# Patient Record
Sex: Female | Born: 1990 | Race: White | Hispanic: No | Marital: Single | Smoking: Former smoker
Health system: Southern US, Community
[De-identification: ages and names within clinical notes are randomized; demographics above are authoritative.]

## PROBLEM LIST (undated history)

## (undated) DIAGNOSIS — R87629 Unspecified abnormal cytological findings in specimens from vagina: Secondary | ICD-10-CM

## (undated) DIAGNOSIS — H9193 Unspecified hearing loss, bilateral: Secondary | ICD-10-CM

## (undated) DIAGNOSIS — F329 Major depressive disorder, single episode, unspecified: Secondary | ICD-10-CM

## (undated) DIAGNOSIS — F419 Anxiety disorder, unspecified: Secondary | ICD-10-CM

## (undated) DIAGNOSIS — R7303 Prediabetes: Secondary | ICD-10-CM

## (undated) DIAGNOSIS — E669 Obesity, unspecified: Secondary | ICD-10-CM

## (undated) DIAGNOSIS — F32A Depression, unspecified: Secondary | ICD-10-CM

## (undated) HISTORY — DX: Depression, unspecified: F32.A

## (undated) HISTORY — DX: Prediabetes: R73.03

## (undated) HISTORY — PX: WISDOM TOOTH EXTRACTION: SHX21

## (undated) HISTORY — DX: Obesity, unspecified: E66.9

## (undated) HISTORY — DX: Anxiety disorder, unspecified: F41.9

## (undated) HISTORY — DX: Unspecified hearing loss, bilateral: H91.93

## (undated) HISTORY — DX: Unspecified abnormal cytological findings in specimens from vagina: R87.629

---
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
## 2010-01-26 ENCOUNTER — Emergency Department: Payer: Self-pay | Admitting: Emergency Medicine

## 2010-01-28 LAB — PATHOLOGY REPORT

## 2014-07-19 ENCOUNTER — Ambulatory Visit: Payer: Self-pay | Admitting: Family Medicine

## 2014-12-13 DIAGNOSIS — R7303 Prediabetes: Secondary | ICD-10-CM | POA: Insufficient documentation

## 2014-12-13 DIAGNOSIS — E669 Obesity, unspecified: Secondary | ICD-10-CM | POA: Insufficient documentation

## 2014-12-17 ENCOUNTER — Ambulatory Visit (INDEPENDENT_AMBULATORY_CARE_PROVIDER_SITE_OTHER): Payer: BLUE CROSS/BLUE SHIELD | Admitting: Family Medicine

## 2014-12-17 ENCOUNTER — Encounter: Payer: Self-pay | Admitting: Family Medicine

## 2014-12-17 VITALS — BP 129/84 | HR 91 | Temp 97.6°F | Wt 215.0 lb

## 2014-12-17 DIAGNOSIS — K625 Hemorrhage of anus and rectum: Secondary | ICD-10-CM | POA: Insufficient documentation

## 2014-12-17 DIAGNOSIS — R350 Frequency of micturition: Secondary | ICD-10-CM | POA: Diagnosis not present

## 2014-12-17 DIAGNOSIS — S39012A Strain of muscle, fascia and tendon of lower back, initial encounter: Secondary | ICD-10-CM

## 2014-12-17 NOTE — Progress Notes (Signed)
BP 129/84 mmHg  Pulse 91  Temp(Src) 97.6 F (36.4 C)  Wt 215 lb (97.523 kg)  SpO2 100%  LMP 09/06/2014 (Approximate)   Subjective:    Patient ID: [name], female    DOB: 1990/08/01, 24 y.o.   MRN: [id number]  HPI: Debbie Robbins is a 24 y.o. female  Chief Complaint  Patient presents with  . Rectal Bleeding    x 1 month. Some cramps and nausea with it.   She noticed bright red blood on the toilet paper last Wednesday after a BM; not large or painful; she does not think she has any hemorrhoids; she has had some mucous in the stool; always has abdominal pain, has cysts in her uterus" and has always assumed that's what hurts; that kind of pain has been at least 5-6 years; had an US done and they said she has cysts in her uterus and they burst every so often; she verifies they are in her uterus, not on her ovaries; this was done at [facility]; no fevers; no weight loss; no rash or joint swelling; she mentioned that the caliber was smaller and then came back to normal; not persistent; comes and goes  She pulled a muscle on the right side of her back changing out kitty litter yesterday; just had a quick question about how long that might last  Also needs a form filled out to allow her to get up to use the restroom at work; I encouraged her last year to stay well-hydrated and her work wanted a note saying she could get up to pee  Family History  Problem Relation Age of Onset  . Anemia Mother   . Cancer Maternal Grandmother     ovarian  . Diabetes Maternal Grandmother   . Diabetes Paternal Grandmother   . Diabetes Paternal Grandfather   . Heart disease Paternal Grandfather     heart attack  no colon cancer in the family; no inflammatory bowel disease to her knowledge  Relevant past medical, surgical, family and social history reviewed and updated as indicated. Interim medical history since our last visit reviewed. Allergies and medications reviewed and updated.  Review of  Systems  Per HPI unless specifically indicated above     Objective:    BP 129/84 mmHg  Pulse 91  Temp(Src) 97.6 F (36.4 C)  Wt 215 lb (97.523 kg)  SpO2 100%  LMP 09/06/2014 (Approximate)  Wt Readings from Last 3 Encounters:  12/17/14 215 lb (97.523 kg)  07/20/14 212 lb (96.163 kg)    Physical Exam  Constitutional: She appears well-developed and well-nourished.  Cardiovascular: Normal rate.   Pulmonary/Chest: Effort normal.  Abdominal: She exhibits no distension and no mass. There is no tenderness. There is no guarding. No hernia.  Genitourinary: Rectal exam shows no external hemorrhoid, no fissure, no mass, no tenderness and anal tone normal. Internal hemorrhoid: perhaps very small hemorrhoid at 7 or 8 o'clock position with patient in left lateral decubitus position. Guaiac negative stool (but just scant amount of material on the glove).  Musculoskeletal:       Lumbar back: She exhibits normal range of motion, no tenderness, no edema, no deformity and no spasm.      Assessment & Plan:   Problem List Items Addressed This Visit      Digestive   Rectal bleeding - Primary    Will have patient increase fiber and water intake; stool cards given to be returned at earliest convenience; discussed referral to GI versus  watch and wait; reviewed in detail red flags and things to watch for; she opts to watch and wait at this point, but will notify me of any changes or persistence in symptoms        Other   Increased urinary frequency    I will be glad to fill out paperwork to allow patient to void at work; she needs permission apparently to leave her bench to urinate and I want her to drink 64 ounces of fluids a day       Other Visit Diagnoses    Low back strain, initial encounter        suggested ice for the first 3 days; OTC analgesics; proper lifting, bending        Follow up plan: No Follow-up on file.

## 2014-12-17 NOTE — Patient Instructions (Addendum)
Please do watch your stools and if the blood recurs, then we'll refer you to see a gastroenterologist; just let us know if you see blood again Please return the stool cards at your earliest convenience Increase your fiber gradually over the coming weeks to a total of 25 or more grams per day Try to drink 64 ounces of water or other caffeine-free beverage every day Let us know of any other symptoms Use ice for the first 3 days of back pain symptoms

## 2014-12-17 NOTE — Assessment & Plan Note (Signed)
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

## 2014-12-17 NOTE — Assessment & Plan Note (Addendum)
Will have patient increase fiber and water intake; stool cards given to be returned at earliest convenience; discussed referral to GI versus watch and wait; reviewed in detail red flags and things to watch for; she opts to watch and wait at this point, but will notify me of any changes or persistence in symptoms

## 2014-12-27 ENCOUNTER — Telehealth: Payer: Self-pay

## 2014-12-27 NOTE — Telephone Encounter (Signed)
Left message to call.

## 2014-12-27 NOTE — Telephone Encounter (Signed)
Patient notified

## 2014-12-27 NOTE — Telephone Encounter (Signed)
Great, please let patient know.

## 2014-12-27 NOTE — Telephone Encounter (Signed)
Stool cards returned all Neg

## 2018-02-28 ENCOUNTER — Other Ambulatory Visit: Payer: Self-pay

## 2018-02-28 ENCOUNTER — Ambulatory Visit: Payer: Commercial Managed Care - PPO | Admitting: Family Medicine

## 2018-02-28 ENCOUNTER — Encounter: Payer: Self-pay | Admitting: Family Medicine

## 2018-02-28 VITALS — BP 144/84 | HR 92 | Temp 98.8°F | Ht 69.0 in | Wt 228.0 lb

## 2018-02-28 DIAGNOSIS — F320 Major depressive disorder, single episode, mild: Secondary | ICD-10-CM

## 2018-02-28 DIAGNOSIS — R030 Elevated blood-pressure reading, without diagnosis of hypertension: Secondary | ICD-10-CM | POA: Diagnosis not present

## 2018-02-28 MED ORDER — HYDROXYZINE PAMOATE 25 MG PO CAPS: 25.0000 mg | ORAL_CAPSULE | Freq: Every evening | ORAL | 0 refills | Status: DC | PRN

## 2018-02-28 MED ORDER — FLUOXETINE HCL 10 MG PO CAPS: 10.0000 mg | ORAL_CAPSULE | Freq: Every day | ORAL | 0 refills | Status: DC

## 2018-02-28 NOTE — Progress Notes (Signed)
[facility]  New Patient Encounter Provider: Mike Gip, FNP    ZOX:[id number]  WJX:[id number]  DOB - October 05, 1990  SUBJECTIVE:   Debbie Robbins, is a 27 y.o. female who presents to establish care with this clinic.   Current problems/concerns:  Patient states that she thinks that she is depressed.  Patient positive for loss of interest and motivation, increased sleeping, not taking care of personal hygeine. Patient states that she has had these feelings for 3-5 years. Family hx: grandmother- bipolar with depression. Brother: depression on IVC.  Denies family hx of suicide.  Denies SI, HI, AH, VH.  Denies previous psychiatric treatment.  Mood: "I am just here".  Patient states that although she is increasing her sleep, she feels exhausted.  Patient reports that her father took wellbutrin for smoking cessation and had side effects.   No Known Allergies Past Medical History:  Diagnosis Date  . Obesity   . Prediabetes    Current Outpatient Medications on File Prior to Visit  Medication Sig Dispense Refill  . Levonorgestrel-Ethinyl Estradiol (SEASONIQUE) 0.15-0.03 &0.01 MG tablet Take 1 tablet by mouth daily.     No current facility-administered medications on file prior to visit.    Family History  Problem Relation Age of Onset  . Anemia Mother   . Cancer Maternal Grandmother        ovarian  . Diabetes Maternal Grandmother   . Diabetes Paternal Grandmother   . Diabetes Paternal Grandfather   . Heart disease Paternal Grandfather        heart attack   Social History   Socioeconomic History  . Marital status: Single    Spouse name: Not on file  . Number of children: Not on file  . Years of education: Not on file  . Highest education level: Not on file  Occupational History  . Not on file  Social Needs  . Financial resource strain: Not on file  . Food insecurity:    Worry: Not on file    Inability: Not on file  .  Transportation needs:    Medical: Not on file    Non-medical: Not on file  Tobacco Use  . Smoking status: Former Games developer  . Smokeless tobacco: Never Used  Substance and Sexual Activity  . Alcohol use: Yes    Comment: socially  . Drug use: No  . Sexual activity: Not on file  Lifestyle  . Physical activity:    Days per week: Not on file    Minutes per session: Not on file  . Stress: Not on file  Relationships  . Social connections:    Talks on phone: Not on file    Gets together: Not on file    Attends religious service: Not on file    Active member of club or organization: Not on file    Attends meetings of clubs or organizations: Not on file    Relationship status: Not on file  . Intimate partner violence:    Fear of current or ex partner: Not on file    Emotionally abused: Not on file    Physically abused: Not on file    Forced sexual activity: Not on file  Other Topics Concern  . Not on file  Social History Narrative  . Not on file    Review of Systems  Constitutional: Negative.   HENT: Negative.   Eyes: Negative.   Respiratory: Negative.   Cardiovascular: Negative.   Gastrointestinal: Negative.  Genitourinary: Negative.   Musculoskeletal: Negative.   Skin: Negative.   Neurological: Negative.   Psychiatric/Behavioral: Positive for depression. Negative for hallucinations, memory loss, substance abuse and suicidal ideas. The patient is nervous/anxious. The patient does not have insomnia.      OBJECTIVE:    BP (!) 144/84 (BP Location: Left Arm, Patient Position: Sitting, Cuff Size: Large)   Pulse 92   Temp 98.8 F (37.1 C) (Oral)   Ht 5\' 9"  (1.753 m)   Wt 228 lb (103.4 kg)   SpO2 100%   BMI 33.67 kg/m   Physical Exam  Constitutional: She is oriented to person, place, and time and well-developed, well-nourished, and in no distress. No distress.  HENT:  Head: Normocephalic and atraumatic.  Eyes: Pupils are equal, round, and reactive to light. Conjunctivae  and EOM are normal.  Neck: Normal range of motion. Neck supple.  Cardiovascular: Normal rate, regular rhythm and intact distal pulses. Exam reveals no gallop and no friction rub.  No murmur heard. Pulmonary/Chest: Effort normal and breath sounds normal. No respiratory distress. She has no wheezes.  Abdominal: Soft. Bowel sounds are normal. There is no tenderness.  Musculoskeletal: Normal range of motion. She exhibits no edema or tenderness.  Lymphadenopathy:    She has no cervical adenopathy.  Neurological: She is alert and oriented to person, place, and time. Gait normal.  Skin: Skin is warm and dry.  Psychiatric: Mood, memory, affect and judgment normal.  Nursing note and vitals reviewed.    ASSESSMENT/PLAN:  1. Current mild episode of major depressive disorder without prior episode (HCC) - FLUoxetine (PROZAC) 10 MG capsule; Take 1 capsule (10 mg total) by mouth daily. Take 1 capsule by mouth QAM x 2 weeks and then increase to 2 caps QAM x 2 weeks.  Dispense: 45 capsule; Refill: 0 - hydrOXYzine (VISTARIL) 25 MG capsule; Take 1 capsule (25 mg total) by mouth at bedtime as needed (insomnia). Take 1-2 caps po QHS PRN.  Dispense: 60 capsule; Refill: 0  2. Elevated blood pressure reading Encouraged diet and exercise. Discussed decreasing caffeine and sodium. Will continue monitor.    Return in about 3 weeks (around 03/21/2018).  The patient was given clear instructions to go to ER or return to medical center if symptoms don't improve, worsen or new problems develop. The patient verbalized understanding. The patient was told to call to get lab results if they haven't heard anything in the next week.     This note has been created with Education officer, environmentalDragon speech recognition software and smart phrase technology. Any transcriptional errors are unintentional.   [name], FNP-BC [facility] [address]  [place], [place] [address] [phone]

## 2018-02-28 NOTE — Patient Instructions (Signed)
I am starting you on a new medication in the SSRI/SNRI family for your depression/anxiety. Take this medication daily as it has been prescribed. You may experience gastrointestinal upset. This usually will stop after you are on the medication for a few days. If you have feelings of euphoria (happy and high), stop the medication immediately and go to the nearest emergency department. If you have suicidal or homicidal thoughts, delusions or hallucinations, stop the medication immediately and go to the nearest emergency department. I would like to see you again in 4 weeks.   Fluoxetine capsules or tablets (Depression/Mood Disorders) What is this medicine? FLUOXETINE (floo OX e teen) belongs to a class of drugs known as selective serotonin reuptake inhibitors (SSRIs). It helps to treat mood problems such as depression, obsessive compulsive disorder, and panic attacks. It can also treat certain eating disorders. This medicine may be used for other purposes; ask your health care provider or pharmacist if you have questions. COMMON BRAND NAME(S): Prozac What should I tell my health care provider before I take this medicine? They need to know if you have any of these conditions: -bipolar disorder or a family history of bipolar disorder -bleeding disorders -glaucoma -heart disease -liver disease -low levels of sodium in the blood -seizures -suicidal thoughts, plans, or attempt; a previous suicide attempt by you or a family member -take MAOIs like Carbex, Eldepryl, Marplan, Nardil, and Parnate -take medicines that treat or prevent blood clots -thyroid disease -an unusual or allergic reaction to fluoxetine, other medicines, foods, dyes, or preservatives -pregnant or trying to get pregnant -breast-feeding How should I use this medicine? Take this medicine by mouth with a glass of water. Follow the directions on the prescription label. You can take this medicine with or without food. Take your medicine at  regular intervals. Do not take it more often than directed. Do not stop taking this medicine suddenly except upon the advice of your doctor. Stopping this medicine too quickly may cause serious side effects or your condition may worsen. A special MedGuide will be given to you by the pharmacist with each prescription and refill. Be sure to read this information carefully each time. Talk to your pediatrician regarding the use of this medicine in children. While this drug may be prescribed for children as young as 7 years for selected conditions, precautions do apply. Overdosage: If you think you have taken too much of this medicine contact a poison control center or emergency room at once. NOTE: This medicine is only for you. Do not share this medicine with others. What if I miss a dose? If you miss a dose, skip the missed dose and go back to your regular dosing schedule. Do not take double or extra doses. What may interact with this medicine? Do not take this medicine with any of the following medications: -other medicines containing fluoxetine, like Sarafem or Symbyax -cisapride -linezolid -MAOIs like Carbex, Eldepryl, Marplan, Nardil, and Parnate -methylene blue (injected into a vein) -pimozide -thioridazine This medicine may also interact with the following medications: -alcohol -amphetamines -aspirin and aspirin-like medicines -carbamazepine -certain medicines for depression, anxiety, or psychotic disturbances -certain medicines for migraine headaches like almotriptan, eletriptan, frovatriptan, naratriptan, rizatriptan, sumatriptan, zolmitriptan -digoxin -diuretics -fentanyl -flecainide -furazolidone -isoniazid -lithium -medicines for sleep -medicines that treat or prevent blood clots like warfarin, enoxaparin, and dalteparin -NSAIDs, medicines for pain and inflammation, like ibuprofen or naproxen -phenytoin -procarbazine -propafenone -rasagiline -ritonavir -supplements like  St. John's wort, kava kava, valerian -tramadol -tryptophan -vinblastine This list  may not describe all possible interactions. Give your health care provider a list of all the medicines, herbs, non-prescription drugs, or dietary supplements you use. Also tell them if you smoke, drink alcohol, or use illegal drugs. Some items may interact with your medicine. What should I watch for while using this medicine? Tell your doctor if your symptoms do not get better or if they get worse. Visit your doctor or health care professional for regular checks on your progress. Because it may take several weeks to see the full effects of this medicine, it is important to continue your treatment as prescribed by your doctor. Patients and their families should watch out for new or worsening thoughts of suicide or depression. Also watch out for sudden changes in feelings such as feeling anxious, agitated, panicky, irritable, hostile, aggressive, impulsive, severely restless, overly excited and hyperactive, or not being able to sleep. If this happens, especially at the beginning of treatment or after a change in dose, call your health care professional. [name] may get drowsy or dizzy. Do not drive, use machinery, or do anything that needs mental alertness until you know how this medicine affects you. Do not stand or sit up quickly, especially if you are an older patient. This reduces the risk of dizzy or fainting spells. Alcohol may interfere with the effect of this medicine. Avoid alcoholic drinks. Your mouth may get dry. Chewing sugarless gum or sucking hard candy, and drinking plenty of water may help. Contact your doctor if the problem does not go away or is severe. This medicine may affect blood sugar levels. If you have diabetes, check with your doctor or health care professional before you change your diet or the dose of your diabetic medicine. What side effects may I notice from receiving this medicine? Side effects that  you should report to your doctor or health care professional as soon as possible: -allergic reactions like skin rash, itching or hives, swelling of the face, lips, or tongue -anxious -black, tarry stools -breathing problems -changes in vision -confusion -elevated mood, decreased need for sleep, racing thoughts, impulsive behavior -eye pain -fast, irregular heartbeat -feeling faint or lightheaded, falls -feeling agitated, angry, or irritable -hallucination, loss of contact with reality -loss of balance or coordination -loss of memory -painful or prolonged erections -restlessness, pacing, inability to keep still -seizures -stiff muscles -suicidal thoughts or other mood changes -trouble sleeping -unusual bleeding or bruising -unusually weak or tired -vomiting Side effects that usually do not require medical attention (report to your doctor or health care professional if they continue or are bothersome): -change in appetite or weight -change in sex drive or performance -diarrhea -dry mouth -headache -increased sweating -nausea -tremors This list may not describe all possible side effects. Call your doctor for medical advice about side effects. You may report side effects to FDA at 1-800-FDA-1088. Where should I keep my medicine? Keep out of the reach of children. Store at room temperature between 15 and 30 degrees C (59 and 86 degrees F). Throw away any unused medicine after the expiration date. NOTE: This sheet is a summary. It may not cover all possible information. If you have questions about this medicine, talk to your doctor, pharmacist, or health care provider.  2018 Elsevier/Gold Standard (2015-10-26 15:55:27) Hydroxyzine capsules or tablets What is this medicine? HYDROXYZINE (hye DROX i zeen) is an antihistamine. This medicine is used to treat allergy symptoms. It is also used to treat anxiety and tension. This medicine can be used with other  medicines to induce sleep  before surgery. This medicine may be used for other purposes; ask your health care provider or pharmacist if you have questions. COMMON BRAND NAME(S): ANX, Atarax, Rezine, Vistaril What should I tell my health care provider before I take this medicine? They need to know if you have any of these conditions: -any chronic illness -difficulty passing urine -glaucoma -heart disease -kidney disease -liver disease -lung disease -an unusual or allergic reaction to hydroxyzine, cetirizine, other medicines, foods, dyes, or preservatives -pregnant or trying to get pregnant -breast-feeding How should I use this medicine? Take this medicine by mouth with a full glass of water. Follow the directions on the prescription label. You may take this medicine with food or on an empty stomach. Take your medicine at regular intervals. Do not take your medicine more often than directed. Talk to your pediatrician regarding the use of this medicine in children. Special care may be needed. While this drug may be prescribed for children as young as 8 years of age for selected conditions, precautions do apply. Patients over 37 years old may have a stronger reaction and need a smaller dose. Overdosage: If you think you have taken too much of this medicine contact a poison control center or emergency room at once. NOTE: This medicine is only for you. Do not share this medicine with others. What if I miss a dose? If you miss a dose, take it as soon as you can. If it is almost time for your next dose, take only that dose. Do not take double or extra doses. What may interact with this medicine? -alcohol -barbiturate medicines for sleep or seizures -medicines for colds, allergies -medicines for depression, anxiety, or emotional disturbances -medicines for pain -medicines for sleep -muscle relaxants This list may not describe all possible interactions. Give your health care provider a list of all the medicines, herbs,  non-prescription drugs, or dietary supplements you use. Also tell them if you smoke, drink alcohol, or use illegal drugs. Some items may interact with your medicine. What should I watch for while using this medicine? Tell your doctor or health care professional if your symptoms do not improve. You may get drowsy or dizzy. Do not drive, use machinery, or do anything that needs mental alertness until you know how this medicine affects you. Do not stand or sit up quickly, especially if you are an older patient. This reduces the risk of dizzy or fainting spells. Alcohol may interfere with the effect of this medicine. Avoid alcoholic drinks. Your mouth may get dry. Chewing sugarless gum or sucking hard candy, and drinking plenty of water may help. Contact your doctor if the problem does not go away or is severe. This medicine may cause dry eyes and blurred vision. If you wear contact lenses you may feel some discomfort. Lubricating drops may help. See your eye doctor if the problem does not go away or is severe. If you are receiving skin tests for allergies, tell your doctor you are using this medicine. What side effects may I notice from receiving this medicine? Side effects that you should report to your doctor or health care professional as soon as possible: -fast or irregular heartbeat -difficulty passing urine -seizures -slurred speech or confusion -tremor Side effects that usually do not require medical attention (report to your doctor or health care professional if they continue or are bothersome): -constipation -drowsiness -fatigue -headache -stomach upset This list may not describe all possible side effects. Call your doctor for  medical advice about side effects. You may report side effects to FDA at 1-800-FDA-1088. Where should I keep my medicine? Keep out of the reach of children. Store at room temperature between 15 and 30 degrees C (59 and 86 degrees F). Keep container tightly closed.  Throw away any unused medicine after the expiration date. NOTE: This sheet is a summary. It may not cover all possible information. If you have questions about this medicine, talk to your doctor, pharmacist, or health care provider.  2018 Elsevier/Gold Standard (2007-10-07 14:50:59)  Mindfulness-Based Stress Reduction Mindfulness-based stress reduction (MBSR) is a program that helps people learn to practice mindfulness. Mindfulness is the practice of intentionally paying attention to the present moment. It can be learned and practiced through techniques such as education, breathing exercises, meditation, and yoga. MBSR includes several mindfulness techniques in one program. MBSR works best when you understand the treatment, are willing to try new things, and can commit to spending time practicing what you learn. MBSR training may include learning about:  How your emotions, thoughts, and reactions affect your body.  New ways to respond to things that cause negative thoughts to start (triggers).  How to notice your thoughts and let go of them.  Practicing awareness of everyday things that you normally do without thinking.  The techniques and goals of different types of meditation.  What are the benefits of MBSR? MBSR can have many benefits, which include helping you to:  Develop self-awareness. This refers to knowing and understanding yourself.  Learn skills and attitudes that help you to participate in your own health care.  Learn new ways to care for yourself.  Be more accepting about how things are, and let things go.  Be less judgmental and approach things with an open mind.  Be patient with yourself and trust yourself more.  MBSR has also been shown to:  Reduce negative emotions, such as depression and anxiety.  Improve memory and focus.  Change how you sense and approach pain.  Boost your body's ability to fight infections.  Help you connect better with other  people.  Improve your sense of well-being.  Follow these instructions at home:  Find a local in-person or online MBSR program.  Set aside some time regularly for mindfulness practice.  Find a mindfulness practice that works best for you. This may include one or more of the following: ? Meditation. Meditation involves focusing your mind on a certain thought or activity. ? Breathing awareness exercises. These help you to stay present by focusing on your breath. ? Body scan. For this practice, you lie down and pay attention to each part of your body from head to toe. You can identify tension and soreness and intentionally relax parts of your body. ? Yoga. Yoga involves stretching and breathing, and it can improve your ability to move and be flexible. It can also provide an experience of testing your body's limits, which can help you release stress. ? Mindful eating. This way of eating involves focusing on the taste, texture, color, and smell of each bite of food. Because this slows down eating and helps you feel full sooner, it can be an important part of a weight-loss plan.  Find a podcast or recording that provides guidance for breathing awareness, body scan, or meditation exercises. You can listen to these any time when you have a free moment to rest without distractions.  Follow your treatment plan as told by your health care provider. This may include taking regular  medicines and making changes to your diet or lifestyle as recommended. How to practice mindfulness To do a basic awareness exercise:  Find a comfortable place to sit.  Pay attention to the present moment. Observe your thoughts, feelings, and surroundings just as they are.  Avoid placing judgment on yourself, your feelings, or your surroundings. Make note of any judgment that comes up, and let it go.  Your mind may wander, and that is okay. Make note of when your thoughts drift, and return your attention to the present  moment.  To do basic mindfulness meditation:  Find a comfortable place to sit. This may include a stable chair or a firm floor cushion. ? Sit upright with your back straight. Let your arms fall next to your side with your hands resting on your legs. ? If sitting in a chair, rest your feet flat on the floor. ? If sitting on a cushion, cross your legs in front of you.  Keep your head in a neutral position with your chin dropped slightly. Relax your jaw and rest the tip of your tongue on the roof of your mouth. Drop your gaze to the floor. You can close your eyes if you like.  Breathe normally and pay attention to your breath. Feel the air moving in and out of your nose. Feel your belly expanding and relaxing with each breath.  Your mind may wander, and that is okay. Make note of when your thoughts drift, and return your attention to your breath.  Avoid placing judgment on yourself, your feelings, or your surroundings. Make note of any judgment or feelings that come up, let them go, and bring your attention back to your breath.  When you are ready, lift your gaze or open your eyes. Pay attention to how your body feels after the meditation.  Where to find more information: You can find more information about MBSR from:  Your health care provider.  Community-based meditation centers or programs.  Programs offered near you.  Summary  Mindfulness-based stress reduction (MBSR) is a program that teaches you how to intentionally pay attention to the present moment. It is used with other treatments to help you cope better with daily stress, emotions, and pain.  MBSR focuses on developing self-awareness, which allows you to respond to life stress without judgment or negative emotions.  MBSR programs may involve learning different mindfulness practices, such as breathing exercises, meditation, yoga, body scan, or mindful eating. Find a mindfulness practice that works best for you, and set aside  time for it on a regular basis. This information is not intended to replace advice given to you by your health care provider. Make sure you discuss any questions you have with your health care provider. Document Released: 10/01/2016 Document Revised: 10/01/2016 Document Reviewed: 10/01/2016 Elsevier Interactive Patient Education  [employer].

## 2018-03-25 ENCOUNTER — Ambulatory Visit: Payer: Commercial Managed Care - PPO | Admitting: Family Medicine

## 2018-03-25 ENCOUNTER — Encounter: Payer: Self-pay | Admitting: Family Medicine

## 2018-03-25 ENCOUNTER — Other Ambulatory Visit: Payer: Self-pay

## 2018-03-25 VITALS — BP 141/80 | HR 80 | Temp 98.0°F | Ht 69.0 in | Wt 226.0 lb

## 2018-03-25 DIAGNOSIS — F320 Major depressive disorder, single episode, mild: Secondary | ICD-10-CM

## 2018-03-25 MED ORDER — FLUOXETINE HCL 40 MG PO CAPS: 40.0000 mg | ORAL_CAPSULE | Freq: Every day | ORAL | 1 refills | Status: DC

## 2018-03-25 NOTE — Patient Instructions (Signed)
Living With Depression Everyone experiences occasional disappointment, sadness, and loss in their lives. When you are feeling down, blue, or sad for at least 2 weeks in a row, it may mean that you have depression. Depression can affect your thoughts and feelings, relationships, daily activities, and physical health. It is caused by changes in the way your brain functions. If you receive a diagnosis of depression, your health care provider will tell you which type of depression you have and what treatment options are available to you. If you are living with depression, there are ways to help you recover from it and also ways to prevent it from coming back. How to cope with lifestyle changes Coping with stress Stress is your body's reaction to life changes and events, both good and bad. Stressful situations may include:  Getting married.  The death of a spouse.  Losing a job.  Retiring.  Having a baby.  Stress can last just a few hours or it can be ongoing. Stress can play a major role in depression, so it is important to learn both how to cope with stress and how to think about it differently. Talk with your health care provider or a counselor if you would like to learn more about stress reduction. He or she may suggest some stress reduction techniques, such as:  Music therapy. This can include creating music or listening to music. Choose music that you enjoy and that inspires you.  Mindfulness-based meditation. This kind of meditation can be done while sitting or walking. It involves being aware of your normal breaths, rather than trying to control your breathing.  Centering prayer. This is a kind of meditation that involves focusing on a spiritual word or phrase. Choose a word, phrase, or sacred image that is meaningful to you and that brings you peace.  Deep breathing. To do this, expand your stomach and inhale slowly through your nose. Hold your breath for 3-5 seconds, then exhale  slowly, allowing your stomach muscles to relax.  Muscle relaxation. This involves intentionally tensing muscles then relaxing them.  Choose a stress reduction technique that fits your lifestyle and personality. Stress reduction techniques take time and practice to develop. Set aside 5-15 minutes a day to do them. Therapists can offer training in these techniques. The training may be covered by some insurance plans. Other things you can do to manage stress include:  Keeping a stress diary. This can help you learn what triggers your stress and ways to control your response.  Understanding what your limits are and saying no to requests or events that lead to a schedule that is too full.  Thinking about how you respond to certain situations. You may not be able to control everything, but you can control how you react.  Adding humor to your life by watching funny films or TV shows.  Making time for activities that help you relax and not feeling guilty about spending your time this way.  Medicines Your health care provider may suggest certain medicines if he or she feels that they will help improve your condition. Avoid using alcohol and other substances that may prevent your medicines from working properly (may interact). It is also important to:  Talk with your pharmacist or health care provider about all the medicines that you take, their possible side effects, and what medicines are safe to take together.  Make it your goal to take part in all treatment decisions (shared decision-making). This includes giving input on the side   effects of medicines. It is best if shared decision-making with your health care provider is part of your total treatment plan.  If your health care provider prescribes a medicine, you may not notice the full benefits of it for 4-8 weeks. Most people who are treated for depression need to be on medicine for at least 6-12 months after they feel better. If you are taking  medicines as part of your treatment, do not stop taking medicines without first talking to your health care provider. You may need to have the medicine slowly decreased (tapered) over time to decrease the risk of harmful side effects. Relationships Your health care provider may suggest family therapy along with individual therapy and drug therapy. While there may not be family problems that are causing you to feel depressed, it is still important to make sure your family learns as much as they can about your mental health. Having your family's support can help make your treatment successful. How to recognize changes in your condition Everyone has a different response to treatment for depression. Recovery from major depression happens when you have not had signs of major depression for two months. This may mean that you will start to:  Have more interest in doing activities.  Feel less hopeless than you did 2 months ago.  Have more energy.  Overeat less often, or have better or improving appetite.  Have better concentration.  Your health care provider will work with you to decide the next steps in your recovery. It is also important to recognize when your condition is getting worse. Watch for these signs:  Having fatigue or low energy.  Eating too much or too little.  Sleeping too much or too little.  Feeling restless, agitated, or hopeless.  Having trouble concentrating or making decisions.  Having unexplained physical complaints.  Feeling irritable, angry, or aggressive.  Get help as soon as you or your family members notice these symptoms coming back. How to get support and help from others How to talk with friends and family members about your condition Talking to friends and family members about your condition can provide you with one way to get support and guidance. Reach out to trusted friends or family members, explain your symptoms to them, and let them know that you are  working with a health care provider to treat your depression. Financial resources Not all insurance plans cover mental health care, so it is important to check with your insurance carrier. If paying for co-pays or counseling services is a problem, search for a local or county mental health care center. They may be able to offer public mental health care services at low or no cost when you are not able to see a private health care provider. If you are taking medicine for depression, you may be able to get the generic form, which may be less expensive. Some makers of prescription medicines also offer help to patients who cannot afford the medicines they need. Follow these instructions at home:  Get the right amount and quality of sleep.  Cut down on using caffeine, tobacco, alcohol, and other potentially harmful substances.  Try to exercise, such as walking or lifting small weights.  Take over-the-counter and prescription medicines only as told by your health care provider.  Eat a healthy diet that includes plenty of vegetables, fruits, whole grains, low-fat dairy products, and lean protein. Do not eat a lot of foods that are high in solid fats, added sugars, or salt.    Keep all follow-up visits as told by your health care provider. This is important. Contact a health care provider if:  You stop taking your antidepressant medicines, and you have any of these symptoms: ? Nausea. ? Headache. ? Feeling lightheaded. ? Chills and body aches. ? Not being able to sleep (insomnia).  You or your friends and family think your depression is getting worse. Get help right away if:  You have thoughts of hurting yourself or others. If you ever feel like you may hurt yourself or others, or have thoughts about taking your own life, get help right away. You can go to your nearest emergency department or call:  Your local emergency services (911 in the [place]  A suicide crisis helpline, such as the  National Suicide Prevention Lifeline at 1-800-273-8255. This is open 24-hours a day.  Summary  If you are living with depression, there are ways to help you recover from it and also ways to prevent it from coming back.  Work with your health care team to create a management plan that includes counseling, stress management techniques, and healthy lifestyle habits. This information is not intended to replace advice given to you by your health care provider. Make sure you discuss any questions you have with your health care provider. Document Released: 04/27/2016 Document Revised: 04/27/2016 Document Reviewed: 04/27/2016 Elsevier Interactive Patient Education  2018 Elsevier Inc.  

## 2018-03-25 NOTE — Progress Notes (Signed)
  [facility]   Progress Note: General Provider: Mike Gip, FNP  SUBJECTIVE:   Debbie Robbins is a 27 y.o. female who  has a past medical history of Obesity and Prediabetes.. Patient presents today for Depression (not much improvement with medications, needs refill on prozac) Patient presents for follow-up on depression.  She states that she has had a few days of improvement but overall mood is still the same.  She is sleeping better with Vistaril.  She states that she is still looking for a Veterinary surgeon.  She denies suicidal homicidal thoughts auditory or visual hallucinations.  She would like to increase the medication. Review of Systems  Psychiatric/Behavioral: Positive for depression (Improving). Negative for hallucinations, memory loss, substance abuse and suicidal ideas. The patient is not nervous/anxious and does not have insomnia.      OBJECTIVE: BP (!) 141/80 (BP Location: Left Arm, Patient Position: Sitting, Cuff Size: Large)   Pulse 80   Temp 98 F (36.7 C) (Oral)   Ht 5\' 9"  (1.753 m)   Wt 226 lb (102.5 kg)   SpO2 100%   BMI 33.37 kg/m   Physical Exam  Constitutional: She is oriented to person, place, and time.  Neurological: She is alert and oriented to person, place, and time.  Psychiatric: She has a normal mood and affect. Her speech is normal and behavior is normal. Judgment and thought content normal. She is not actively hallucinating. Cognition and memory are normal.  Nursing note and vitals reviewed.   ASSESSMENT/PLAN:  1. Current mild episode of major depressive disorder without prior episode (HCC) Increased prozac to 40mg  daily. Continue with vistaril - FLUoxetine (PROZAC) 40 MG capsule; Take 1 capsule (40 mg total) by mouth daily.  Dispense: 30 capsule; Refill: 1        The patient was given clear instructions to go to ER or return to medical center if symptoms do not improve, worsen or new problems  develop. The patient verbalized understanding and agreed with plan of care.   Ms. Debbie Robbins. Riley Lam, FNP-BC [facility] [address] [place], [place] [address] [phone]     This note has been created with Dragon speech recognition software and smart phrase technology. Any transcriptional errors are unintentional.

## 2018-05-11 ENCOUNTER — Other Ambulatory Visit: Payer: Self-pay

## 2018-05-11 DIAGNOSIS — F320 Major depressive disorder, single episode, mild: Secondary | ICD-10-CM

## 2018-05-11 MED ORDER — FLUOXETINE HCL 40 MG PO CAPS: 40.0000 mg | ORAL_CAPSULE | Freq: Every day | ORAL | 1 refills | Status: DC

## 2018-05-27 ENCOUNTER — Ambulatory Visit (INDEPENDENT_AMBULATORY_CARE_PROVIDER_SITE_OTHER): Payer: Commercial Managed Care - PPO | Admitting: Family Medicine

## 2018-05-27 ENCOUNTER — Encounter: Payer: Self-pay | Admitting: Family Medicine

## 2018-05-27 DIAGNOSIS — F320 Major depressive disorder, single episode, mild: Secondary | ICD-10-CM | POA: Diagnosis not present

## 2018-05-27 MED ORDER — PROPRANOLOL HCL 20 MG PO TABS: 20.0000 mg | ORAL_TABLET | Freq: Two times a day (BID) | ORAL | 2 refills | Status: DC

## 2018-05-27 MED ORDER — HYDROXYZINE PAMOATE 25 MG PO CAPS: 25.0000 mg | ORAL_CAPSULE | Freq: Every evening | ORAL | 0 refills | Status: DC | PRN

## 2018-05-27 MED ORDER — SERTRALINE HCL 50 MG PO TABS: 50.0000 mg | ORAL_TABLET | Freq: Every day | ORAL | 0 refills | Status: DC

## 2018-05-27 NOTE — Patient Instructions (Addendum)
You can stop Prozac and start Zoloft 50 mg daily x 2 weeks. Increase to 100mg  after.  You can take melatonin for sleep along with hydroxyzine.    Sertraline tablets What is this medicine? SERTRALINE (SER tra leen) is used to treat depression. It may also be used to treat obsessive compulsive disorder, panic disorder, post-trauma stress, premenstrual dysphoric disorder (PMDD) or social anxiety. This medicine may be used for other purposes; ask your health care provider or pharmacist if you have questions. COMMON BRAND NAME(S): Zoloft What should I tell my health care provider before I take this medicine? They need to know if you have any of these conditions: -bleeding disorders -bipolar disorder or a family history of bipolar disorder -glaucoma -heart disease -high blood pressure -history of irregular heartbeat -history of low levels of calcium, magnesium, or potassium in the blood -if you often drink alcohol -liver disease -receiving electroconvulsive therapy -seizures -suicidal thoughts, plans, or attempt; a previous suicide attempt by you or a family member -take medicines that treat or prevent blood clots -thyroid disease -an unusual or allergic reaction to sertraline, other medicines, foods, dyes, or preservatives -pregnant or trying to get pregnant -breast-feeding How should I use this medicine? Take this medicine by mouth with a glass of water. Follow the directions on the prescription label. You can take it with or without food. Take your medicine at regular intervals. Do not take your medicine more often than directed. Do not stop taking this medicine suddenly except upon the advice of your doctor. Stopping this medicine too quickly may cause serious side effects or your condition may worsen. A special MedGuide will be given to you by the pharmacist with each prescription and refill. Be sure to read this information carefully each time. Talk to your pediatrician regarding the  use of this medicine in children. While this drug may be prescribed for children as young as 7 years for selected conditions, precautions do apply. Overdosage: If you think you have taken too much of this medicine contact a poison control center or emergency room at once. NOTE: This medicine is only for you. Do not share this medicine with others. What if I miss a dose? If you miss a dose, take it as soon as you can. If it is almost time for your next dose, take only that dose. Do not take double or extra doses. What may interact with this medicine? Do not take this medicine with any of the following medications: -cisapride -dofetilide -dronedarone -linezolid -MAOIs like Carbex, Eldepryl, Marplan, Nardil, and Parnate -methylene blue (injected into a vein) -pimozide -thioridazine This medicine may also interact with the following medications: -alcohol -amphetamines -aspirin and aspirin-like medicines -certain medicines for depression, anxiety, or psychotic disturbances -certain medicines for fungal infections like ketoconazole, fluconazole, posaconazole, and itraconazole -certain medicines for irregular heart beat like flecainide, quinidine, propafenone -certain medicines for migraine headaches like almotriptan, eletriptan, frovatriptan, naratriptan, rizatriptan, sumatriptan, zolmitriptan -certain medicines for sleep -certain medicines for seizures like carbamazepine, valproic acid, phenytoin -certain medicines that treat or prevent blood clots like warfarin, enoxaparin, dalteparin -cimetidine -digoxin -diuretics -fentanyl -isoniazid -lithium -NSAIDs, medicines for pain and inflammation, like ibuprofen or naproxen -other medicines that prolong the QT interval (cause an abnormal heart rhythm) -rasagiline -safinamide -supplements like St. John's wort, kava kava, valerian -tolbutamide -tramadol -tryptophan This list may not describe all possible interactions. Give your health care  provider a list of all the medicines, herbs, non-prescription drugs, or dietary supplements you use. Also tell them if  you smoke, drink alcohol, or use illegal drugs. Some items may interact with your medicine. What should I watch for while using this medicine? Tell your doctor if your symptoms do not get better or if they get worse. Visit your doctor or health care professional for regular checks on your progress. Because it may take several weeks to see the full effects of this medicine, it is important to continue your treatment as prescribed by your doctor. Patients and their families should watch out for new or worsening thoughts of suicide or depression. Also watch out for sudden changes in feelings such as feeling anxious, agitated, panicky, irritable, hostile, aggressive, impulsive, severely restless, overly excited and hyperactive, or not being able to sleep. If this happens, especially at the beginning of treatment or after a change in dose, call your health care professional. [name] may get drowsy or dizzy. Do not drive, use machinery, or do anything that needs mental alertness until you know how this medicine affects you. Do not stand or sit up quickly, especially if you are an older patient. This reduces the risk of dizzy or fainting spells. Alcohol may interfere with the effect of this medicine. Avoid alcoholic drinks. Your mouth may get dry. Chewing sugarless gum or sucking hard candy, and drinking plenty of water may help. Contact your doctor if the problem does not go away or is severe. What side effects may I notice from receiving this medicine? Side effects that you should report to your doctor or health care professional as soon as possible: -allergic reactions like skin rash, itching or hives, swelling of the face, lips, or tongue -anxious -black, tarry stools -changes in vision -confusion -elevated mood, decreased need for sleep, racing thoughts, impulsive behavior -eye pain -fast,  irregular heartbeat -feeling faint or lightheaded, falls -feeling agitated, angry, or irritable -hallucination, loss of contact with reality -loss of balance or coordination -loss of memory -painful or prolonged erections -restlessness, pacing, inability to keep still -seizures -stiff muscles -suicidal thoughts or other mood changes -trouble sleeping -unusual bleeding or bruising -unusually weak or tired -vomiting Side effects that usually do not require medical attention (report to your doctor or health care professional if they continue or are bothersome): -change in appetite or weight -change in sex drive or performance -diarrhea -increased sweating -indigestion, nausea -tremors This list may not describe all possible side effects. Call your doctor for medical advice about side effects. You may report side effects to FDA at 1-800-FDA-1088. Where should I keep my medicine? Keep out of the reach of children. Store at room temperature between 15 and 30 degrees C (59 and 86 degrees F). Throw away any unused medicine after the expiration date. NOTE: This sheet is a summary. It may not cover all possible information. If you have questions about this medicine, talk to your doctor, pharmacist, or health care provider.  2019 Elsevier/Gold Standard (2016-05-29 14:17:49) Propranolol tablets What is this medicine? PROPRANOLOL (proe PRAN oh lole) is a beta-blocker. Beta-blockers reduce the workload on the heart and help it to beat more regularly. This medicine is used to treat high blood pressure, to control irregular heart rhythms (arrhythmias) and to relieve chest pain caused by angina. It may also be helpful after a heart attack. This medicine is also used to prevent migraine headaches, relieve uncontrollable shaking (tremors), and help certain problems related to the thyroid gland and adrenal gland. This medicine may be used for other purposes; ask your health care provider or pharmacist if  you have  questions. COMMON BRAND NAME(S): Inderal What should I tell my health care provider before I take this medicine? They need to know if you have any of these conditions: -circulation problems or blood vessel disease -diabetes -history of heart attack or heart disease, vasospastic angina -kidney disease -liver disease -lung or breathing disease, like asthma or emphysema -pheochromocytoma -slow heart rate -thyroid disease -an unusual or allergic reaction to propranolol, other beta-blockers, medicines, foods, dyes, or preservatives -pregnant or trying to get pregnant -breast-feeding How should I use this medicine? Take this medicine by mouth with a glass of water. Follow the directions on the prescription label. Take your doses at regular intervals. Do not take your medicine more often than directed. Do not stop taking except on your the advice of your doctor or health care professional. Talk to your pediatrician regarding the use of this medicine in children. Special care may be needed. Overdosage: If you think you have taken too much of this medicine contact a poison control center or emergency room at once. NOTE: This medicine is only for you. Do not share this medicine with others. What if I miss a dose? If you miss a dose, take it as soon as you can. If it is almost time for your next dose, take only that dose. Do not take double or extra doses. What may interact with this medicine? Do not take this medicine with any of the following medications: -feverfew -phenothiazines like chlorpromazine, mesoridazine, prochlorperazine, thioridazine This medicine may also interact with the following medications: -aluminum hydroxide gel -antipyrine -antiviral medicines for HIV or AIDS -barbiturates like phenobarbital -certain medicines for blood pressure, heart disease, irregular heart beat -cimetidine -ciprofloxacin -diazepam -fluconazole -haloperidol -isoniazid -medicines for  cholesterol like cholestyramine or colestipol -medicines for mental depression -medicines for migraine headache like almotriptan, eletriptan, frovatriptan, naratriptan, rizatriptan, sumatriptan, zolmitriptan -NSAIDs, medicines for pain and inflammation, like ibuprofen or naproxen -phenytoin -rifampin -teniposide -theophylline -thyroid medicines -tolbutamide -warfarin -zileuton This list may not describe all possible interactions. Give your health care provider a list of all the medicines, herbs, non-prescription drugs, or dietary supplements you use. Also tell them if you smoke, drink alcohol, or use illegal drugs. Some items may interact with your medicine. What should I watch for while using this medicine? Visit your doctor or health care professional for regular check ups. Check your blood pressure and pulse rate regularly. Ask your health care professional what your blood pressure and pulse rate should be, and when you should contact them. You may get drowsy or dizzy. Do not drive, use machinery, or do anything that needs mental alertness until you know how this drug affects you. Do not stand or sit up quickly, especially if you are an older patient. This reduces the risk of dizzy or fainting spells. Alcohol can make you more drowsy and dizzy. Avoid alcoholic drinks. This medicine can affect blood sugar levels. If you have diabetes, check with your doctor or health care professional before you change your diet or the dose of your diabetic medicine. Do not treat yourself for coughs, colds, or pain while you are taking this medicine without asking your doctor or health care professional for advice. Some ingredients may increase your blood pressure. What side effects may I notice from receiving this medicine? Side effects that you should report to your doctor or health care professional as soon as possible: -allergic reactions like skin rash, itching or hives, swelling of the face, lips, or  tongue -breathing problems -changes in blood sugar -  cold hands or feet -difficulty sleeping, nightmares -dry peeling skin -hallucinations -muscle cramps or weakness -slow heart rate -swelling of the legs and ankles -vomiting Side effects that usually do not require medical attention (report to your doctor or health care professional if they continue or are bothersome): -change in sex drive or performance -diarrhea -dry sore eyes -hair loss -nausea -weak or tired This list may not describe all possible side effects. Call your doctor for medical advice about side effects. You may report side effects to FDA at 1-800-FDA-1088. Where should I keep my medicine? Keep out of the reach of children. Store at room temperature between 15 and 30 degrees C (59 and 86 degrees F). Protect from light. Throw away any unused medicine after the expiration date. NOTE: This sheet is a summary. It may not cover all possible information. If you have questions about this medicine, talk to your doctor, pharmacist, or health care provider.  2019 Elsevier/Gold Standard (2013-01-27 14:51:53)

## 2018-05-27 NOTE — Progress Notes (Signed)
  [facility]   Progress Note: General Provider: Mike GipAndre Rosaire Cueto, FNP  SUBJECTIVE:   Debbie Robbins is a 27 y.o. female who  has a past medical history of Obesity and Prediabetes.. Patient presents today for Follow-up and Depression Patient presents for follow-up on depression.  She states that since increasing her Prozac, she noticed a jittery feeling that she does not like.  Patient states that she continues to have anxiety especially in social situations.  Would like to change to another medication in the SSRI category. She denies suicidal ideations, intent, or plan.  She denies homicidal ideations, auditory hallucinations, or visual hallucinations.  She also endorses difficulty with falling and staying asleep.  She has not tried any medications for this in the past.  Review of Systems  Constitutional: Negative.   HENT: Negative.   Eyes: Negative.   Skin: Negative.   Neurological: Negative.   Psychiatric/Behavioral: Positive for depression. Negative for hallucinations, memory loss, substance abuse and suicidal ideas. The patient is nervous/anxious and has insomnia.      OBJECTIVE: BP 140/86 (BP Location: Left Arm, Patient Position: Sitting, Cuff Size: Large)   Pulse 77   Temp 98.8 F (37.1 C) (Oral)   Resp 16   Ht 5\' 9"  (1.753 m)   Wt 216 lb (98 kg)   LMP 05/24/2018   SpO2 100%   BMI 31.90 kg/m   Wt Readings from Last 3 Encounters:  05/27/18 216 lb (98 kg)  03/25/18 226 lb (102.5 kg)  02/28/18 228 lb (103.4 kg)     Physical Exam  ASSESSMENT/PLAN:   1. Current mild episode of major depressive disorder without prior episode [facility](HCC) Patient to discontinue Prozac.  She will start Zoloft at 50 mg once a day for 2 weeks and then increase to 2 pills daily for total of 500 mg a day. We will start on a trial of propanolol for social anxiety.  She can take this twice a day as needed.  Discussed benefits and risks of medication. We  will also start Vistaril for insomnia 25 to 50 mg nightly. - sertraline (ZOLOFT) 50 MG tablet; Take 1 tablet (50 mg total) by mouth daily. Take 1 pill po x 2 weeks then increase to 2 pills daily.  Dispense: 45 tablet; Refill: 0 - propranolol (INDERAL) 20 MG tablet; Take 1 tablet (20 mg total) by mouth 2 (two) times daily.  Dispense: 60 tablet; Refill: 2 - hydrOXYzine (VISTARIL) 25 MG capsule; Take 1 capsule (25 mg total) by mouth at bedtime as needed (insomnia). Take 1-2 caps po QHS PRN.  Dispense: 60 capsule; Refill: 0    Return in about 3 weeks (around 06/17/2018) for F/u depression new medication. .    The patient was given clear instructions to go to ER or return to medical center if symptoms do not improve, worsen or new problems develop. The patient verbalized understanding and agreed with plan of care.   Ms. Freda Jacksonndr L. Riley Lamouglas, FNP-BC [facility] [address]  [place], [place] [id number] [phone]

## 2018-06-16 DIAGNOSIS — N939 Abnormal uterine and vaginal bleeding, unspecified: Secondary | ICD-10-CM | POA: Insufficient documentation

## 2018-06-20 ENCOUNTER — Ambulatory Visit (INDEPENDENT_AMBULATORY_CARE_PROVIDER_SITE_OTHER): Payer: Commercial Managed Care - PPO | Admitting: Family Medicine

## 2018-06-20 ENCOUNTER — Encounter: Payer: Self-pay | Admitting: Family Medicine

## 2018-06-20 VITALS — BP 134/80 | HR 64 | Temp 98.7°F | Resp 16 | Ht 69.0 in | Wt 215.0 lb

## 2018-06-20 DIAGNOSIS — F320 Major depressive disorder, single episode, mild: Secondary | ICD-10-CM

## 2018-06-20 MED ORDER — PROPRANOLOL HCL ER 60 MG PO CP24: 60.0000 mg | ORAL_CAPSULE | Freq: Every day | ORAL | 5 refills | Status: DC

## 2018-06-20 MED ORDER — SERTRALINE HCL 50 MG PO TABS: 75.0000 mg | ORAL_TABLET | Freq: Every day | ORAL | 3 refills | Status: DC

## 2018-06-20 NOTE — Patient Instructions (Signed)
Generalized Anxiety Disorder, Adult Generalized anxiety disorder (GAD) is a mental health disorder. People with this condition constantly worry about everyday events. Unlike normal anxiety, worry related to GAD is not triggered by a specific event. These worries also do not fade or get better with time. GAD interferes with life functions, including relationships, work, and school. GAD can vary from mild to severe. People with severe GAD can have intense waves of anxiety with physical symptoms (panic attacks). What are the causes? The exact cause of GAD is not known. What increases the risk? This condition is more likely to develop in:  Women.  People who have a family history of anxiety disorders.  People who are very shy.  People who experience very stressful life events, such as the death of a loved one.  People who have a very stressful family environment. What are the signs or symptoms? People with GAD often worry excessively about many things in their lives, such as their health and family. They may also be overly concerned about:  Doing well at work.  Being on time.  Natural disasters.  Friendships. Physical symptoms of GAD include:  Fatigue.  Muscle tension or having muscle twitches.  Trembling or feeling shaky.  Being easily startled.  Feeling like your heart is pounding or racing.  Feeling out of breath or like you cannot take a deep breath.  Having trouble falling asleep or staying asleep.  Sweating.  Nausea, diarrhea, or irritable bowel syndrome (IBS).  Headaches.  Trouble concentrating or remembering facts.  Restlessness.  Irritability. How is this diagnosed? Your health care provider can diagnose GAD based on your symptoms and medical history. You will also have a physical exam. The health care provider will ask specific questions about your symptoms, including how severe they are, when they started, and if they come and go. Your health care  provider may ask you about your use of alcohol or drugs, including prescription medicines. Your health care provider may refer you to a mental health specialist for further evaluation. Your health care provider will do a thorough examination and may perform additional tests to rule out other possible causes of your symptoms. To be diagnosed with GAD, a person must have anxiety that:  Is out of his or her control.  Affects several different aspects of his or her life, such as work and relationships.  Causes distress that makes him or her unable to take part in normal activities.  Includes at least three physical symptoms of GAD, such as restlessness, fatigue, trouble concentrating, irritability, muscle tension, or sleep problems. Before your health care provider can confirm a diagnosis of GAD, these symptoms must be present more days than they are not, and they must last for six months or longer. How is this treated? The following therapies are usually used to treat GAD:  Medicine. Antidepressant medicine is usually prescribed for long-term daily control. Antianxiety medicines may be added in severe cases, especially when panic attacks occur.  Talk therapy (psychotherapy). Certain types of talk therapy can be helpful in treating GAD by providing support, education, and guidance. Options include: ? Cognitive behavioral therapy (CBT). People learn coping skills and techniques to ease their anxiety. They learn to identify unrealistic or negative thoughts and behaviors and to replace them with positive ones. ? Acceptance and commitment therapy (ACT). This treatment teaches people how to be mindful as a way to cope with unwanted thoughts and feelings. ? Biofeedback. This process trains you to manage your body's response (  physiological response) through breathing techniques and relaxation methods. You will work with a therapist while machines are used to monitor your physical symptoms.  Stress  management techniques. These include yoga, meditation, and exercise. A mental health specialist can help determine which treatment is best for you. Some people see improvement with one type of therapy. However, other people require a combination of therapies. Follow these instructions at home:  Take over-the-counter and prescription medicines only as told by your health care provider.  Try to maintain a normal routine.  Try to anticipate stressful situations and allow extra time to manage them.  Practice any stress management or self-calming techniques as taught by your health care provider.  Do not punish yourself for setbacks or for not making progress.  Try to recognize your accomplishments, even if they are small.  Keep all follow-up visits as told by your health care provider. This is important. Contact a health care provider if:  Your symptoms do not get better.  Your symptoms get worse.  You have signs of depression, such as: ? A persistently sad, cranky, or irritable mood. ? Loss of enjoyment in activities that used to bring you joy. ? Change in weight or eating. ? Changes in sleeping habits. ? Avoiding friends or family members. ? Loss of energy for normal tasks. ? Feelings of guilt or worthlessness. Get help right away if:  You have serious thoughts about hurting yourself or others. If you ever feel like you may hurt yourself or others, or have thoughts about taking your own life, get help right away. You can go to your nearest emergency department or call:  Your local emergency services (911 in the [place]  A suicide crisis helpline, such as the [place] at 2188233781. This is open 24 hours a day. Summary  Generalized anxiety disorder (GAD) is a mental health disorder that involves worry that is not triggered by a specific event.  People with GAD often worry excessively about many things in their lives, such as their health and  family.  GAD may cause physical symptoms such as restlessness, trouble concentrating, sleep problems, frequent sweating, nausea, diarrhea, headaches, and trembling or muscle twitching.  A mental health specialist can help determine which treatment is best for you. Some people see improvement with one type of therapy. However, other people require a combination of therapies. This information is not intended to replace advice given to you by your health care provider. Make sure you discuss any questions you have with your health care provider. Document Released: 09/19/2012 Document Revised: 04/14/2016 Document Reviewed: 04/14/2016 Elsevier Interactive Patient Education  2019 [place]. Major Depressive Disorder, Adult Major depressive disorder (MDD) is a mental health condition. MDD often makes you feel sad, hopeless, or helpless. MDD can also cause symptoms in your body. MDD can affect your:  Work.  School.  Relationships.  Other normal activities. MDD can range from mild to very bad. It may occur once (single episode MDD). It can also occur many times (recurrent MDD). The main symptoms of MDD often include:  Feeling sad, depressed, or irritable most of the time.  Loss of interest. MDD symptoms also include:  Sleeping too much or too little.  Eating too much or too little.  A change in your weight.  Feeling tired (fatigue) or having low energy.  Feeling worthless.  Feeling guilty.  Trouble making decisions.  Trouble thinking clearly.  Thoughts of suicide or harming others.  Feeling weak.  Feeling agitated.  Keeping yourself from being around other people (isolation). Follow these instructions at home: Activity  Do these things as told by your doctor: ? Go back to your normal activities. ? Exercise regularly. ? Spend time outdoors. Alcohol  Talk with your doctor about how alcohol can affect your antidepressant medicines.  Do not drink alcohol. Or, limit  how much alcohol you drink. ? This means no more than 1 drink a day for nonpregnant women and 2 drinks a day for men. One drink equals one of these:  12 oz of beer.  5 oz of wine.  1 oz of hard liquor. General instructions  Take over-the-counter and prescription medicines only as told by your doctor.  Eat a healthy diet.  Get plenty of sleep.  Find activities that you enjoy. Make time to do them.  Think about joining a support group. Your doctor may be able to suggest a group for you.  Keep all follow-up visits as told by your doctor. This is important. Where to find more information:  The [employer] on Mental Illness: ? www.nami.org  [employer] of Mental Health: ? http://www.maynard.net/www.nimh.nih.gov  National Suicide Prevention Lifeline: ? 857-274-73801-(909)217-9533. This is free, 24-hour help. Contact a doctor if:  Your symptoms get worse.  You have new symptoms. Get help right away if:  You self-harm.  You see, hear, taste, smell, or feel things that are not present (hallucinate). If you ever feel like you may hurt yourself or others, or have thoughts about taking your own life, get help right away. You can go to your nearest emergency department or call:  Your local emergency services (911 in the [place]  A suicide crisis helpline, such as the National Suicide Prevention Lifeline: ? 817-685-91741-(909)217-9533. This is open 24 hours a day. This information is not intended to replace advice given to you by your health care provider. Make sure you discuss any questions you have with your health care provider. Document Released: 05/06/2015 Document Revised: 02/09/2016 Document Reviewed: 02/09/2016 Elsevier Interactive Patient Education  2019 Else[employer] Inc. Propranolol extended-release capsules What is this medicine? PROPRANOLOL (proe PRAN oh lole) is a beta-blocker. Beta-blockers reduce the workload on the heart and help it to beat more regularly. This medicine is used to treat high blood  pressure, heart muscle disease, and prevent chest pain caused by angina. It is also used to prevent migraine headaches. You should not use this medicine to treat a migraine that has already started. This medicine may be used for other purposes; ask your health care provider or pharmacist if you have questions. COMMON BRAND NAME(S): Inderal LA, Inderal XL, InnoPran XL What should I tell my health care provider before I take this medicine? They need to know if you have any of these conditions: -circulation problems, or blood vessel disease -diabetes -history of heart attack or heart disease, vasospastic angina -kidney disease -liver disease -lung or breathing disease, like asthma or emphysema -pheochromocytoma -slow heart rate -thyroid disease -an unusual or allergic reaction to propranolol, other beta-blockers, medicines, foods, dyes, or preservatives -pregnant or trying to get pregnant -breast-feeding How should I use this medicine? Take this medicine by mouth with a glass of water. Follow the directions on the prescription label. Do not crush or chew. Take your doses at regular intervals. Do not take your medicine more often than directed. Do not stop taking except on the advice of your doctor or health care professional. Talk to your pediatrician regarding the use of this medicine in children. Special  care may be needed. Overdosage: If you think you have taken too much of this medicine contact a poison control center or emergency room at once. NOTE: This medicine is only for you. Do not share this medicine with others. What if I miss a dose? If you miss a dose, take it as soon as you can. If it is almost time for your next dose, take only that dose. Do not take double or extra doses. What may interact with this medicine? Do not take this medicine with any of the following medications: -feverfew -phenothiazines like chlorpromazine, mesoridazine, prochlorperazine, thioridazine This  medicine may also interact with the following medications: -aluminum hydroxide gel -antipyrine -antiviral medicines for HIV or AIDS -barbiturates like phenobarbital -certain medicines for blood pressure, heart disease, irregular heart beat -cimetidine -ciprofloxacin -diazepam -fluconazole -haloperidol -isoniazid -medicines for cholesterol like cholestyramine or colestipol -medicines for mental depression -medicines for migraine headache like almotriptan, eletriptan, frovatriptan, naratriptan, rizatriptan, sumatriptan, zolmitriptan -NSAIDs, medicines for pain and inflammation, like ibuprofen or naproxen -phenytoin -rifampin -teniposide -theophylline -thyroid medicines -tolbutamide -warfarin -zileuton This list may not describe all possible interactions. Give your health care provider a list of all the medicines, herbs, non-prescription drugs, or dietary supplements you use. Also tell them if you smoke, drink alcohol, or use illegal drugs. Some items may interact with your medicine. What should I watch for while using this medicine? Visit your doctor or health care professional for regular check ups. Contact your doctor right away if your symptoms worsen. Check your blood pressure and pulse rate regularly. Ask your health care professional what your blood pressure and pulse rate should be, and when you should contact them. Do not stop taking this medicine suddenly. This could lead to serious heart-related effects. You may get drowsy or dizzy. Do not drive, use machinery, or do anything that needs mental alertness until you know how this drug affects you. Do not stand or sit up quickly, especially if you are an older patient. This reduces the risk of dizzy or fainting spells. Alcohol can make you more drowsy and dizzy. Avoid alcoholic drinks. This medicine can affect blood sugar levels. If you have diabetes, check with your doctor or health care professional before you change your diet or  the dose of your diabetic medicine. Do not treat yourself for coughs, colds, or pain while you are taking this medicine without asking your doctor or health care professional for advice. Some ingredients may increase your blood pressure. What side effects may I notice from receiving this medicine? Side effects that you should report to your doctor or health care professional as soon as possible: -allergic reactions like skin rash, itching or hives, swelling of the face, lips, or tongue -breathing problems -changes in blood sugar -cold hands or feet -difficulty sleeping, nightmares -dry peeling skin -hallucinations -muscle cramps or weakness -slow heart rate -swelling of the legs and ankles -vomiting Side effects that usually do not require medical attention (report to your doctor or health care professional if they continue or are bothersome): -change in sex drive or performance -diarrhea -dry sore eyes -hair loss -nausea -weak or tired This list may not describe all possible side effects. Call your doctor for medical advice about side effects. You may report side effects to FDA at 1-800-FDA-1088. Where should I keep my medicine? Keep out of the reach of children. Store at room temperature between 15 and 30 degrees C (59 and 86 degrees F). Protect from light, moisture and freezing. Keep container tightly  closed. Throw away any unused medicine after the expiration date. NOTE: This sheet is a summary. It may not cover all possible information. If you have questions about this medicine, talk to your doctor, pharmacist, or health care provider.  2019 Elsevier/Gold Standard (2013-01-27 14:58:56)

## 2018-06-20 NOTE — Progress Notes (Signed)
  [facility]   Progress Note: General Provider: Mike Gip, FNP  SUBJECTIVE:   Debbie Robbins is a 28 y.o. female who  has a past medical history of Obesity and Prediabetes.. Patient presents today for Follow-up and Depression Patient reports mild withdrawal symptoms during transition from Prozac to Zoloft.  Patient states that she is doing well now and does not report any side effects of the 50 mg of Zoloft.  Patient states that she is doing well on the propanolol, but can feel when it wears off.  States that she starts to feel jittery. Patient denies suicidal ideations, intent, or plan.  Would like to continue Zoloft. States that depression has improved but she is continuing to have mild symptoms with anxiety.    Review of Systems  Constitutional: Negative.   Psychiatric/Behavioral: Positive for depression (improving). The patient is nervous/anxious (improving).      OBJECTIVE: BP 134/80 (BP Location: Right Arm, Patient Position: Sitting, Cuff Size: Large)   Pulse 64   Temp 98.7 F (37.1 C) (Oral)   Resp 16   Ht 5\' 9"  (1.753 m)   Wt 215 lb (97.5 kg)   LMP 06/20/2018   SpO2 100%   BMI 31.75 kg/m   Wt Readings from Last 3 Encounters:  06/20/18 215 lb (97.5 kg)  05/27/18 216 lb (98 kg)  03/25/18 226 lb (102.5 kg)     Physical Exam Vitals signs and nursing note reviewed.  Constitutional:      General: She is not in acute distress.    Appearance: Normal appearance.  HENT:     Head: Normocephalic and atraumatic.  Neurological:     Mental Status: She is alert.  Psychiatric:        Attention and Perception: Attention and perception normal.        Mood and Affect: Mood and affect normal.        Speech: Speech normal.        Behavior: Behavior normal. Behavior is cooperative.        Thought Content: Thought content normal. Thought content is not paranoid or delusional. Thought content does not include homicidal or suicidal  ideation. Thought content does not include homicidal or suicidal plan.        Cognition and Memory: Cognition and memory normal.        Judgment: Judgment normal.     ASSESSMENT/PLAN:  1. Current mild episode of major depressive disorder without prior episode (HCC) Increased zoloft to 75mg  QD. Changed inderal to LA 60 mg daily.  - propranolol ER (INDERAL LA) 60 MG 24 hr capsule; Take 1 capsule (60 mg total) by mouth daily for 30 days.  Dispense: 30 capsule; Refill: 5 - sertraline (ZOLOFT) 50 MG tablet; Take 1.5 tablets (75 mg total) by mouth daily.  Dispense: 45 tablet; Refill: 3    Return in about 4 weeks (around 07/18/2018) for anxiety.    The patient was given clear instructions to go to ER or return to medical center if symptoms do not improve, worsen or new problems develop. The patient verbalized understanding and agreed with plan of care.   Ms. Debbie Robbins. Debbie Lam, FNP-BC [facility] [address] [place], [place] [address] [phone]

## 2018-06-21 ENCOUNTER — Other Ambulatory Visit: Payer: Self-pay | Admitting: Obstetrics and Gynecology

## 2018-06-21 DIAGNOSIS — F419 Anxiety disorder, unspecified: Secondary | ICD-10-CM | POA: Insufficient documentation

## 2018-06-21 DIAGNOSIS — Z3141 Encounter for fertility testing: Secondary | ICD-10-CM

## 2018-06-21 DIAGNOSIS — F32A Depression, unspecified: Secondary | ICD-10-CM | POA: Insufficient documentation

## 2018-06-27 ENCOUNTER — Ambulatory Visit (HOSPITAL_COMMUNITY): Payer: Commercial Managed Care - PPO

## 2018-06-28 ENCOUNTER — Ambulatory Visit (HOSPITAL_COMMUNITY)
Admission: RE | Admit: 2018-06-28 | Discharge: 2018-06-28 | Disposition: A | Discharge status: Home / Self Care | Payer: Commercial Managed Care - PPO | Source: Ambulatory Visit | Attending: Obstetrics and Gynecology | Admitting: Obstetrics and Gynecology

## 2018-06-28 DIAGNOSIS — Z3141 Encounter for fertility testing: Secondary | ICD-10-CM

## 2018-06-28 MED ORDER — IOPAMIDOL (ISOVUE-300) INJECTION 61%
30.0000 mL | Freq: Once | INTRAVENOUS | Status: AC | PRN
  Administered 2018-06-28: 8 mL via INTRAVENOUS

## 2018-07-01 ENCOUNTER — Other Ambulatory Visit: Payer: Self-pay | Admitting: Family Medicine

## 2018-07-01 DIAGNOSIS — F320 Major depressive disorder, single episode, mild: Secondary | ICD-10-CM

## 2018-07-18 ENCOUNTER — Encounter: Payer: Self-pay | Admitting: Family Medicine

## 2018-07-18 ENCOUNTER — Ambulatory Visit (INDEPENDENT_AMBULATORY_CARE_PROVIDER_SITE_OTHER): Payer: Commercial Managed Care - PPO | Admitting: Family Medicine

## 2018-07-18 VITALS — BP 123/74 | HR 57 | Temp 98.8°F | Resp 14 | Ht 69.0 in | Wt 215.0 lb

## 2018-07-18 DIAGNOSIS — F320 Major depressive disorder, single episode, mild: Secondary | ICD-10-CM

## 2018-07-18 MED ORDER — FLUOXETINE HCL 40 MG PO CAPS: 40.0000 mg | ORAL_CAPSULE | Freq: Every day | ORAL | 3 refills | Status: DC

## 2018-07-18 MED ORDER — FLUOXETINE HCL 20 MG PO TABS: 20.0000 mg | ORAL_TABLET | Freq: Every day | ORAL | 0 refills | Status: DC

## 2018-07-18 NOTE — Patient Instructions (Addendum)
Zoloft 50mg  x 1 week then 25 mg x 1 week Prozac 10 x 1 week then prozac 20mg  x 1 week then prozac 30mg . Take 40mg  daily thereafter.    Fluoxetine capsules or tablets (Depression/Mood Disorders) What is this medicine? FLUOXETINE (floo OX e teen) belongs to a class of drugs known as selective serotonin reuptake inhibitors (SSRIs). It helps to treat mood problems such as depression, obsessive compulsive disorder, and panic attacks. It can also treat certain eating disorders. This medicine may be used for other purposes; ask your health care provider or pharmacist if you have questions. COMMON BRAND NAME(S): Prozac What should I tell my health care provider before I take this medicine? They need to know if you have any of these conditions: -bipolar disorder or a family history of bipolar disorder -bleeding disorders -glaucoma -heart disease -liver disease -low levels of sodium in the blood -seizures -suicidal thoughts, plans, or attempt; a previous suicide attempt by you or a family member -take MAOIs like Carbex, Eldepryl, Marplan, Nardil, and Parnate -take medicines that treat or prevent blood clots -thyroid disease -an unusual or allergic reaction to fluoxetine, other medicines, foods, dyes, or preservatives -pregnant or trying to get pregnant -breast-feeding How should I use this medicine? Take this medicine by mouth with a glass of water. Follow the directions on the prescription label. You can take this medicine with or without food. Take your medicine at regular intervals. Do not take it more often than directed. Do not stop taking this medicine suddenly except upon the advice of your doctor. Stopping this medicine too quickly may cause serious side effects or your condition may worsen. A special MedGuide will be given to you by the pharmacist with each prescription and refill. Be sure to read this information carefully each time. Talk to your pediatrician regarding the use of this  medicine in children. While this drug may be prescribed for children as young as 7 years for selected conditions, precautions do apply. Overdosage: If you think you have taken too much of this medicine contact a poison control center or emergency room at once. NOTE: This medicine is only for you. Do not share this medicine with others. What if I miss a dose? If you miss a dose, skip the missed dose and go back to your regular dosing schedule. Do not take double or extra doses. What may interact with this medicine? Do not take this medicine with any of the following medications: -other medicines containing fluoxetine, like Sarafem or Symbyax -cisapride -dronedarone -linezolid -MAOIs like Carbex, Eldepryl, Marplan, Nardil, and Parnate -methylene blue (injected into a vein) -pimozide -thioridazine This medicine may also interact with the following medications: -alcohol -amphetamines -aspirin and aspirin-like medicines -carbamazepine -certain medicines for depression, anxiety, or psychotic disturbances -certain medicines for migraine headaches like almotriptan, eletriptan, frovatriptan, naratriptan, rizatriptan, sumatriptan, zolmitriptan -digoxin -diuretics -fentanyl -flecainide -furazolidone -isoniazid -lithium -medicines for sleep -medicines that treat or prevent blood clots like warfarin, enoxaparin, and dalteparin -NSAIDs, medicines for pain and inflammation, like ibuprofen or naproxen -other medicines that prolong the QT interval (an abnormal heart rhythm) -phenytoin -procarbazine -propafenone -rasagiline -ritonavir -supplements like St. John's wort, kava kava, valerian -tramadol -tryptophan -vinblastine This list may not describe all possible interactions. Give your health care provider a list of all the medicines, herbs, non-prescription drugs, or dietary supplements you use. Also tell them if you smoke, drink alcohol, or use illegal drugs. Some items may interact with  your medicine. What should I watch for while using this  medicine? Tell your doctor if your symptoms do not get better or if they get worse. Visit your doctor or health care professional for regular checks on your progress. Because it may take several weeks to see the full effects of this medicine, it is important to continue your treatment as prescribed by your doctor. Patients and their families should watch out for new or worsening thoughts of suicide or depression. Also watch out for sudden changes in feelings such as feeling anxious, agitated, panicky, irritable, hostile, aggressive, impulsive, severely restless, overly excited and hyperactive, or not being able to sleep. If this happens, especially at the beginning of treatment or after a change in dose, call your health care professional. [name] may get drowsy or dizzy. Do not drive, use machinery, or do anything that needs mental alertness until you know how this medicine affects you. Do not stand or sit up quickly, especially if you are an older patient. This reduces the risk of dizzy or fainting spells. Alcohol may interfere with the effect of this medicine. Avoid alcoholic drinks. Your mouth may get dry. Chewing sugarless gum or sucking hard candy, and drinking plenty of water may help. Contact your doctor if the problem does not go away or is severe. This medicine may affect blood sugar levels. If you have diabetes, check with your doctor or health care professional before you change your diet or the dose of your diabetic medicine. What side effects may I notice from receiving this medicine? Side effects that you should report to your doctor or health care professional as soon as possible: -allergic reactions like skin rash, itching or hives, swelling of the face, lips, or tongue -anxious -black, tarry stools -breathing problems -changes in vision -confusion -elevated mood, decreased need for sleep, racing thoughts, impulsive behavior -eye  pain -fast, irregular heartbeat -feeling faint or lightheaded, falls -feeling agitated, angry, or irritable -hallucination, loss of contact with reality -loss of balance or coordination -loss of memory -painful or prolonged erections -restlessness, pacing, inability to keep still -seizures -stiff muscles -suicidal thoughts or other mood changes -trouble sleeping -unusual bleeding or bruising -unusually weak or tired -vomiting Side effects that usually do not require medical attention (report to your doctor or health care professional if they continue or are bothersome): -change in appetite or weight -change in sex drive or performance -diarrhea -dry mouth -headache -increased sweating -nausea -tremors This list may not describe all possible side effects. Call your doctor for medical advice about side effects. You may report side effects to FDA at 1-800-FDA-1088. Where should I keep my medicine? Keep out of the reach of children. Store at room temperature between 15 and 30 degrees C (59 and 86 degrees F). Throw away any unused medicine after the expiration date. NOTE: This sheet is a summary. It may not cover all possible information. If you have questions about this medicine, talk to your doctor, pharmacist, or health care provider.  2019 [employer] Standard (2018-01-13 11:56:53)

## 2018-07-18 NOTE — Progress Notes (Signed)
[facility]   Progress Note: General Provider: Mike Gip, FNP  SUBJECTIVE:   Debbie Robbins is a 28 y.o. female who  has a past medical history of Obesity and Prediabetes.. Patient presents today for Follow-up (anxeity is better ) and Joint Pain (poping in joints )  Patient presents for follow up on anxiety and depression. She states that she is doing better with the propranolol XR. She would like to continue with this medication. She states that she would like to restart prozac because she felt better on it.Patient states that she has had intrusive thoughts that included death but she denies suicidal ideations, intent or plan. She denies psychosis, delusions or substance abuse.  Review of Systems  Constitutional: Negative.   HENT: Negative.   Eyes: Negative.   Respiratory: Negative.   Cardiovascular: Negative.   Gastrointestinal: Negative.   Genitourinary: Negative.   Musculoskeletal: Negative.   Skin: Negative.   Neurological: Negative.   Psychiatric/Behavioral: Positive for depression (improving). Negative for hallucinations, memory loss, substance abuse and suicidal ideas. The patient is nervous/anxious (improving). The patient does not have insomnia.      OBJECTIVE: BP 123/74 (BP Location: Left Arm, Patient Position: Sitting, Cuff Size: Normal)   Pulse (!) 57   Temp 98.8 F (37.1 C) (Oral)   Resp 14   Ht 5\' 9"  (1.753 m)   Wt 215 lb (97.5 kg)   LMP 07/18/2018   SpO2 100%   BMI 31.75 kg/m   Wt Readings from Last 3 Encounters:  07/18/18 215 lb (97.5 kg)  06/20/18 215 lb (97.5 kg)  05/27/18 216 lb (98 kg)     Physical Exam Constitutional:      Appearance: Normal appearance. She is well-developed and well-groomed. She is obese.  Neurological:     Mental Status: She is alert.  Psychiatric:        Attention and Perception: Attention and perception normal.        Mood and Affect: Mood and affect normal.        Speech:  Speech normal.        Behavior: Behavior normal. Behavior is cooperative.        Thought Content: Thought content normal.        Cognition and Memory: Cognition and memory normal.        Judgment: Judgment normal.     ASSESSMENT/PLAN:   1. Current mild episode of major depressive disorder without prior episode (HCC) Wean off zoloft and restart prozac. Patient given schedule to wean.  Zoloft 50mg  x 1 week then 25 mg x 1 week. Do this while starting prozac.  Prozac 10 x 1 week then prozac 20mg  x 1 week then prozac 30mg . Take 40mg  daily thereafter.  - FLUoxetine (PROZAC) 20 MG tablet; Take 1 tablet (20 mg total) by mouth daily. 10mg  x 1 week, 20mg  x 1 week, 30 x 1 week, 40 x 1 week  Dispense: 35 tablet; Refill: 0 - FLUoxetine (PROZAC) 40 MG capsule; Take 1 capsule (40 mg total) by mouth daily.  Dispense: 30 capsule; Refill: 3    Return in about 6 weeks (around 08/29/2018) for anxiety.    The patient was given clear instructions to go to ER or return to medical center if symptoms do not improve, worsen or new problems develop. The patient verbalized understanding and agreed with plan of care.   Ms. Debbie Robbins. Debbie Lam, FNP-BC [facility] [address]  [place], [place] [address] [phone]

## 2018-07-28 ENCOUNTER — Other Ambulatory Visit: Payer: Self-pay

## 2018-07-28 DIAGNOSIS — F320 Major depressive disorder, single episode, mild: Secondary | ICD-10-CM

## 2018-07-28 MED ORDER — HYDROXYZINE PAMOATE 25 MG PO CAPS: ORAL_CAPSULE | ORAL | 0 refills | Status: DC

## 2018-08-29 ENCOUNTER — Ambulatory Visit: Payer: Commercial Managed Care - PPO | Admitting: Family Medicine

## 2018-09-01 ENCOUNTER — Ambulatory Visit: Payer: Commercial Managed Care - PPO | Admitting: Family Medicine

## 2018-09-02 ENCOUNTER — Other Ambulatory Visit: Payer: Self-pay | Admitting: Family Medicine

## 2018-09-02 DIAGNOSIS — F320 Major depressive disorder, single episode, mild: Secondary | ICD-10-CM

## 2018-09-09 DIAGNOSIS — F320 Major depressive disorder, single episode, mild: Secondary | ICD-10-CM

## 2018-09-12 MED ORDER — HYDROXYZINE PAMOATE 25 MG PO CAPS: ORAL_CAPSULE | ORAL | 2 refills | Status: DC

## 2018-09-12 MED ORDER — FLUOXETINE HCL 40 MG PO CAPS: 40.0000 mg | ORAL_CAPSULE | Freq: Every day | ORAL | 3 refills | Status: DC

## 2018-11-23 ENCOUNTER — Telehealth: Payer: Self-pay

## 2018-11-23 NOTE — Telephone Encounter (Signed)
Called to do COVID Screening for appointment tomorrow. No answer. Left a message to call back. Thanks! 

## 2018-11-24 ENCOUNTER — Other Ambulatory Visit: Payer: Self-pay

## 2018-11-24 ENCOUNTER — Encounter: Payer: Self-pay | Admitting: Family Medicine

## 2018-11-24 ENCOUNTER — Ambulatory Visit (INDEPENDENT_AMBULATORY_CARE_PROVIDER_SITE_OTHER): Payer: Commercial Managed Care - PPO | Admitting: Family Medicine

## 2018-11-24 VITALS — BP 135/90 | HR 68 | Temp 99.0°F | Resp 16 | Ht 69.0 in | Wt 217.0 lb

## 2018-11-24 DIAGNOSIS — F320 Major depressive disorder, single episode, mild: Secondary | ICD-10-CM

## 2018-11-24 DIAGNOSIS — H9193 Unspecified hearing loss, bilateral: Secondary | ICD-10-CM | POA: Diagnosis not present

## 2018-11-24 DIAGNOSIS — L2089 Other atopic dermatitis: Secondary | ICD-10-CM

## 2018-11-24 MED ORDER — TRIAMCINOLONE ACETONIDE 0.5 % EX OINT: 1.0000 "application " | TOPICAL_OINTMENT | Freq: Two times a day (BID) | CUTANEOUS | 0 refills | Status: DC

## 2018-11-24 NOTE — Patient Instructions (Addendum)
I am referring you to an audiologist for further evaluation.    Preventing Hearing Loss, Adult  Hearing loss is a partial or total loss of the ability to hear. Hearing loss may start suddenly or gradually, at any age. It may be temporary or permanent, and it may affect one or both ears. There are two types of hearing loss. You can have just one type or both types. You may have a problem with:  Damage to your hearing nerves (sensorineural hearing loss). This type of hearing loss is more likely to be permanent. A hearing aid is often the best treatment.  Sound getting to your inner ear (conductive hearing loss). This type of hearing loss can usually be treated medically or surgically. Hearing loss may be referred to as deafness. Symptoms that may develop along with hearing loss include ringing in your ear (tinnitus), fullness in your ear, and dizziness (vertigo). Hearing loss that is not associated with aging can often be prevented by taking certain measures to protect your ears. How can hearing loss affect me? Hearing loss can affect you at school, at work, and at home. It can lower your overall quality of life. You may:  Have trouble having conversations, especially in busy places with a lot of background noise.  Need to make changes at work or at school.  Feel depressed, isolated, or anxious. Sometimes hearing loss can make it more difficult to make friends, play sports, and have an active social life.  Struggle to hear the TV, radio, or sound at the movies.  Have trouble hearing the phone or doorbell.  Have trouble hearing alarms and other warning sounds. What changes can I make to protect myself from hearing loss? Have hearing tests (screenings) as often as directed by your health care provider. Hearing screenings can help detect hearing loss early and may help prevent hearing loss from getting worse. This may include having screenings done by:  An ear, nose, and throat specialist  (otolaryngologist or ENT specialist).  A specialist in hearing problems (audiologist). Noise damage is the most preventable cause of hearing loss. Noise damage is caused by the loudness of the noise and how long you are exposed to it. Noises that can cause temporary or permanent nerve damage include noises from:  Cable[employer] SystemsLawn mowers or chainsaws.  Guns (firearms).  Sirens.  [employer] engines. To avoid hearing loss from noise exposure:  Wear ear protection whenever you are exposed to loud noises or working in a noisy environment, such as: ? Using a lawn mower or leaf blower. ? Shooting firearms. ? Woodworking with machines. ? Being near jet engines or sirens.  If you are exposed to loud noise at your job, make sure that you are provided with the proper noise protection.  Do not sit close to speakers at concerts.  If you listen to music, keep the volume at a comfortable level.  If you wear headphones, make sure that the noise is only loud enough for you to hear. If someone else can hear it, it is too loud. What can I do to cope with hearing loss?  Work with your health care providers to determine what types of treatment are best for you.  If you need a hearing aid, have it fitted by a specialist. Do not buy hearing aids without having a hearing aid evaluation first.  Use assistive hearing devices and warning devices or alarms that vibrate or use lights.  Face people who are talking to you and pay attention  to their expressions as they speak. Ask people if they can speak more clearly or more slowly.  Tell friends and family about your hearing loss.  Avoid areas that have a lot of background noise.  If you are feeling isolated, anxious, or depressed, tell someone. Where to find support For more support:  Talk with your health care provider. Ask about hearing screenings and online or in-person support groups.  Find resources through the Hearing Loss Association of [place]:  www.hearingloss.org  Get tips for living with hearing loss from the [employer] Association for the Deaf and Hard of Hearing: www.agbell.org Where to find more information Find more information about how to prevent hearing loss from:  Centers for Disease Control and Prevention: FootballExhibition.com.brwww.cdc.gov  [employer] on Deafness and Other Communication Disorders: PoolDevices.com.ptwww.nidcd.nih.gov Contact a health care provider if you have:  Any change in your hearing.  Sudden hearing loss.  Other symptoms, such as: ? Ear pain. ? Ear pressure. ? Tinnitus. ? Vertigo. Summary  Have your hearing screened to detect early hearing loss and to prevent further loss.  Hearing loss may be caused by damage to your hearing nerves, problems with sound getting to your inner ear, or both.  Avoiding loud noises is the best way to prevent hearing loss.  If your hearing changes suddenly, tell your health care provider right away. This information is not intended to replace advice given to you by your health care provider. Make sure you discuss any questions you have with your health care provider. Document Released: 07/14/2017 Document Revised: 07/14/2017 Document Reviewed: 07/14/2017 Elsevier Interactive Patient Education  2019 Elsevier Inc.   Atopic Dermatitis Atopic dermatitis is a skin disorder that causes inflammation of the skin. This is the most common type of eczema. Eczema is a group of skin conditions that cause the skin to be itchy, red, and swollen. This condition is generally worse during the cooler winter months and often improves during the warm summer months. Symptoms can vary from person to person. Atopic dermatitis usually starts showing signs in infancy and can last through adulthood. This condition cannot be passed from one person to another (non-contagious), but it is more common in families. Atopic dermatitis may not always be present. When it is present, it is called a flare-up. What are  the causes? The exact cause of this condition is not known. Flare-ups of the condition may be triggered by:  Contact with something that you are sensitive or allergic to.  Stress.  Certain foods.  Extremely hot or cold weather.  Harsh chemicals and soaps.  Dry air.  Chlorine. What increases the risk? This condition is more likely to develop in people who have a personal history or family history of eczema, allergies, asthma, or hay fever. What are the signs or symptoms? Symptoms of this condition include:  Dry, scaly skin.  Red, itchy rash.  Itchiness, which can be severe. This may occur before the skin rash. This can make sleeping difficult.  Skin thickening and cracking that can occur over time. How is this diagnosed? This condition is diagnosed based on your symptoms, a medical history, and a physical exam. How is this treated? There is no cure for this condition, but symptoms can usually be controlled. Treatment focuses on:  Controlling the itchiness and scratching. You may be given medicines, such as antihistamines or steroid creams.  Limiting exposure to things that you are sensitive or allergic to (allergens).  Recognizing situations that cause stress and developing a  plan to manage stress. If your atopic dermatitis does not get better with medicines, or if it is all over your body (widespread), a treatment using a specific type of light (phototherapy) may be used. Follow these instructions at home: Skin care   Keep your skin well-moisturized. Doing this seals in moisture and helps to prevent dryness. ? Use unscented lotions that have petroleum in them. ? Avoid lotions that contain alcohol or water. They can dry the skin.  Keep baths or showers short (less than 5 minutes) in warm water. Do not use hot water. ? Use mild, unscented cleansers for bathing. Avoid soap and bubble bath. ? Apply a moisturizer to your skin right after a bath or shower.  Do not apply  anything to your skin without checking with your health care provider. General instructions  Dress in clothes made of cotton or cotton blends. Dress lightly because heat increases itchiness.  When washing your clothes, rinse your clothes twice so all of the soap is removed.  Avoid any triggers that can cause a flare-up.  Try to manage your stress.  Keep your fingernails cut short.  Avoid scratching. Scratching makes the rash and itchiness worse. It may also result in a skin infection (impetigo) due to a break in the skin caused by scratching.  Take or apply over-the-counter and prescription medicines only as told by your health care provider.  Keep all follow-up visits as told by your health care provider. This is important.  Do not be around people who have cold sores or fever blisters. If you get the infection, it may cause your atopic dermatitis to worsen. Contact a health care provider if:  Your itchiness interferes with sleep.  Your rash gets worse or it is not better within one week of starting treatment.  You have a fever.  You have a rash flare-up after having contact with someone who has cold sores or fever blisters. Get help right away if:  You develop pus or soft yellow scabs in the rash area. Summary  This condition causes a red rash and itchy, dry, scaly skin.  Treatment focuses on controlling the itchiness and scratching, limiting exposure to things that you are sensitive or allergic to (allergens), recognizing situations that cause stress, and developing a plan to manage stress.  Keep your skin well-moisturized.  Keep baths or showers shorter than 5 minutes and use warm water. Do not use hot water. This information is not intended to replace advice given to you by your health care provider. Make sure you discuss any questions you have with your health care provider. Document Released: 05/22/2000 Document Revised: 06/26/2016 Document Reviewed:  06/26/2016 Elsevier Interactive Patient Education  2019 Elsevier Inc.  Living With Anxiety  After being diagnosed with an anxiety disorder, you may be relieved to know why you have felt or behaved a certain way. It is natural to also feel overwhelmed about the treatment ahead and what it will mean for your life. With care and support, you can manage this condition and recover from it. How to cope with anxiety Dealing with stress Stress is your body's reaction to life changes and events, both good and bad. Stress can last just a few hours or it can be ongoing. Stress can play a major role in anxiety, so it is important to learn both how to cope with stress and how to think about it differently. Talk with your health care provider or a counselor to learn more about stress reduction. He  or she may suggest some stress reduction techniques, such as:  Music therapy. This can include creating or listening to music that you enjoy and that inspires you.  Mindfulness-based meditation. This involves being aware of your normal breaths, rather than trying to control your breathing. It can be done while sitting or walking.  Centering prayer. This is a kind of meditation that involves focusing on a word, phrase, or sacred image that is meaningful to you and that brings you peace.  Deep breathing. To do this, expand your stomach and inhale slowly through your nose. Hold your breath for 3-5 seconds. Then exhale slowly, allowing your stomach muscles to relax.  Self-talk. This is a skill where you identify thought patterns that lead to anxiety reactions and correct those thoughts.  Muscle relaxation. This involves tensing muscles then relaxing them. Choose a stress reduction technique that fits your lifestyle and personality. Stress reduction techniques take time and practice. Set aside 5-15 minutes a day to do them. Therapists can offer training in these techniques. The training may be covered by some insurance  plans. Other things you can do to manage stress include:  Keeping a stress diary. This can help you learn what triggers your stress and ways to control your response.  Thinking about how you respond to certain situations. You may not be able to control everything, but you can control your reaction.  Making time for activities that help you relax, and not feeling guilty about spending your time in this way. Therapy combined with coping and stress-reduction skills provides the best chance for successful treatment. Medicines Medicines can help ease symptoms. Medicines for anxiety include:  Anti-anxiety drugs.  Antidepressants.  Beta-blockers. Medicines may be used as the main treatment for anxiety disorder, along with therapy, or if other treatments are not working. Medicines should be prescribed by a health care provider. Relationships Relationships can play a big part in helping you recover. Try to spend more time connecting with trusted friends and family members. Consider going to couples counseling, taking family education classes, or going to family therapy. Therapy can help you and others better understand the condition. How to recognize changes in your condition Everyone has a different response to treatment for anxiety. Recovery from anxiety happens when symptoms decrease and stop interfering with your daily activities at home or work. This may mean that you will start to:  Have better concentration and focus.  Sleep better.  Be less irritable.  Have more energy.  Have improved memory. It is important to recognize when your condition is getting worse. Contact your health care provider if your symptoms interfere with home or work and you do not feel like your condition is improving. Where to find help and support: You can get help and support from these sources:  Self-help groups.  Online and [employer].  A trusted spiritual leader.  Couples  counseling.  Family education classes.  Family therapy. Follow these instructions at home:  Eat a healthy diet that includes plenty of vegetables, fruits, whole grains, low-fat dairy products, and lean protein. Do not eat a lot of foods that are high in solid fats, added sugars, or salt.  Exercise. Most adults should do the following: ? Exercise for at least 150 minutes each week. The exercise should increase your heart rate and make you sweat (moderate-intensity exercise). ? Strengthening exercises at least twice a week.  Cut down on caffeine, tobacco, alcohol, and other potentially harmful substances.  Get the right amount and  quality of sleep. Most adults need 7-9 hours of sleep each night.  Make choices that simplify your life.  Take over-the-counter and prescription medicines only as told by your health care provider.  Avoid caffeine, alcohol, and certain over-the-counter cold medicines. These may make you feel worse. Ask your pharmacist which medicines to avoid.  Keep all follow-up visits as told by your health care provider. This is important. Questions to ask your health care provider  Would I benefit from therapy?  How often should I follow up with a health care provider?  How long do I need to take medicine?  Are there any long-term side effects of my medicine?  Are there any alternatives to taking medicine? Contact a health care provider if:  You have a hard time staying focused or finishing daily tasks.  You spend many hours a day feeling worried about everyday life.  You become exhausted by worry.  You start to have headaches, feel tense, or have nausea.  You urinate more than normal.  You have diarrhea. Get help right away if:  You have a racing heart and shortness of breath.  You have thoughts of hurting yourself or others. If you ever feel like you may hurt yourself or others, or have thoughts about taking your own life, get help right away. You  can go to your nearest emergency department or call:  Your local emergency services (911 in the [place]  A suicide crisis helpline, such as the National Suicide Prevention Lifeline at 223-031-9168. This is open 24-hours a day. Summary  Taking steps to deal with stress can help calm you.  Medicines cannot cure anxiety disorders, but they can help ease symptoms.  Family, friends, and partners can play a big part in helping you recover from an anxiety disorder. This information is not intended to replace advice given to you by your health care provider. Make sure you discuss any questions you have with your health care provider. Document Released: 05/19/2016 Document Revised: 05/19/2016 Document Reviewed: 05/19/2016 Elsevier Interactive Patient Education  2019 [employer].

## 2018-11-24 NOTE — Progress Notes (Signed)
[facility]   Progress Note: General Provider: Mike GipAndre Greidy Sherard, FNP  SUBJECTIVE:   Debbie Robbins is a 28 y.o. female who  has a past medical history of Obesity and Prediabetes.. Patient presents today for Hearing Problem and Skin Problem (peeling on hands )  Patient states that her husband and friends have been telling her that she needs to get her hearing checked due to having to ask them to repeat what they say.  She also states that she has small blisters on the palms of her hands that "dry out and peel". She states that she has noticed this since increasing her hand washing and using hand sanitizer. She denies a history of eczema.  She is doing well on her current anxiety and depression medications. She currently denies any depressive symptoms. She denies delusional thinking, psychosis, homicidal/suicidal ideations, intent or plan.    Review of Systems  Constitutional: Negative.   HENT: Positive for hearing loss.   Eyes: Negative.   Respiratory: Negative.   Cardiovascular: Negative.   Gastrointestinal: Negative.   Genitourinary: Negative.   Musculoskeletal: Negative.   Skin: Negative.   Neurological: Negative.   Psychiatric/Behavioral: Negative.      OBJECTIVE: BP 135/90 (BP Location: Right Arm, Patient Position: Sitting, Cuff Size: Large)   Pulse 68   Temp 99 F (37.2 C) (Oral)   Resp 16   Ht 5\' 9"  (1.753 m)   Wt 217 lb (98.4 kg)   LMP 11/02/2018   SpO2 100%   BMI 32.05 kg/m   Wt Readings from Last 3 Encounters:  11/24/18 217 lb (98.4 kg)  07/18/18 215 lb (97.5 kg)  06/20/18 215 lb (97.5 kg)     Physical Exam Vitals signs and nursing note reviewed.  Constitutional:      General: She is not in acute distress.    Appearance: Normal appearance.  HENT:     Head: Normocephalic and atraumatic.     Right Ear: There is impacted cerumen (lavage performed. no abnormalities noted. ).  Eyes:     Extraocular Movements:  Extraocular movements intact.     Conjunctiva/sclera: Conjunctivae normal.     Pupils: Pupils are equal, round, and reactive to light.  Cardiovascular:     Rate and Rhythm: Normal rate and regular rhythm.     Heart sounds: No murmur.  Pulmonary:     Effort: Pulmonary effort is normal.     Breath sounds: Normal breath sounds.  Musculoskeletal: Normal range of motion.  Skin:    General: Skin is warm and dry.  Neurological:     Mental Status: She is alert and oriented to person, place, and time.  Psychiatric:        Mood and Affect: Mood normal.        Behavior: Behavior normal.        Thought Content: Thought content normal.        Judgment: Judgment normal.     ASSESSMENT/PLAN:  1. Bilateral hearing loss, unspecified hearing loss type Patient states that this has been for several years. No changes after removal of cerumen.  - Ambulatory referral to Audiology  2. Other atopic dermatitis Can mix with mild lotion.  - triamcinolone ointment (KENALOG) 0.5 %; Apply 1 application topically 2 (two) times daily.  Dispense: 30 g; Refill: 0  3. Current mild episode of major depressive disorder without prior episode [facility](HCC) Patient is doing well and is to continue with current medications.   Return in  about 3 months (around 02/24/2019) for anxiety.    The patient was given clear instructions to go to ER or return to medical center if symptoms do not improve, worsen or new problems develop. The patient verbalized understanding and agreed with plan of care.   Ms. Doug Sou. Nathaneil Canary, FNP-BC [facility] [address] [place], [place] [address] [phone]

## 2018-12-12 ENCOUNTER — Telehealth: Payer: Self-pay

## 2018-12-12 NOTE — Telephone Encounter (Signed)
Pt called to inquire if her medications were safe since finding out that she was pregnant. Notified Provider. Called pt back to advise to stop taking propranolol and follow up with OB at this time per provider.

## 2018-12-23 LAB — OB RESULTS CONSOLE GC/CHLAMYDIA
Chlamydia: NEGATIVE
Gonorrhea: NEGATIVE

## 2018-12-23 LAB — OB RESULTS CONSOLE ABO/RH: RH Type: POSITIVE

## 2018-12-23 LAB — OB RESULTS CONSOLE HEPATITIS B SURFACE ANTIGEN: Hepatitis B Surface Ag: NEGATIVE

## 2018-12-23 LAB — OB RESULTS CONSOLE RPR: RPR: NONREACTIVE

## 2018-12-23 LAB — OB RESULTS CONSOLE HIV ANTIBODY (ROUTINE TESTING): HIV: NONREACTIVE

## 2018-12-23 LAB — OB RESULTS CONSOLE ANTIBODY SCREEN: Antibody Screen: NEGATIVE

## 2018-12-23 LAB — OB RESULTS CONSOLE RUBELLA ANTIBODY, IGM: Rubella: IMMUNE

## 2019-02-15 ENCOUNTER — Encounter (HOSPITAL_COMMUNITY): Payer: Self-pay

## 2019-02-15 ENCOUNTER — Encounter (HOSPITAL_COMMUNITY): Payer: Self-pay | Admitting: *Deleted

## 2019-02-24 ENCOUNTER — Ambulatory Visit: Payer: Commercial Managed Care - PPO | Admitting: Family Medicine

## 2019-06-09 NOTE — L&D Delivery Note (Signed)
Delivery Note At 3:27 PM a viable female was delivered via Vaginal, Spontaneous (Presentation:   Occiput Anterior).  APGAR: 8, 9 weight pending .   Placenta status: Spontaneous, Intact.  Cord: 3 vessels with the following complications: None.  Cord pH: not obtained  Anesthesia: Epidural Episiotomy: None Lacerations: 1st degree Suture Repair: 3.0 chromic Est. Blood Loss (mL): 300  Mom to postpartum.  Baby to Couplet care / Skin to Skin.  Jeani Hawking 08/09/2019, 3:37 PM

## 2019-07-27 ENCOUNTER — Telehealth (HOSPITAL_COMMUNITY): Payer: Self-pay | Admitting: *Deleted

## 2019-07-27 ENCOUNTER — Encounter (HOSPITAL_COMMUNITY): Payer: Self-pay | Admitting: *Deleted

## 2019-07-27 NOTE — Telephone Encounter (Signed)
Preadmission screen  

## 2019-07-28 ENCOUNTER — Encounter (HOSPITAL_COMMUNITY): Payer: Self-pay | Admitting: *Deleted

## 2019-08-04 LAB — OB RESULTS CONSOLE GBS: GBS: NEGATIVE

## 2019-08-07 ENCOUNTER — Other Ambulatory Visit (HOSPITAL_COMMUNITY)
Admission: RE | Admit: 2019-08-07 | Discharge: 2019-08-07 | Disposition: A | Discharge status: Home / Self Care | Payer: Commercial Managed Care - PPO | Source: Ambulatory Visit | Attending: Obstetrics and Gynecology | Admitting: Obstetrics and Gynecology

## 2019-08-07 LAB — SARS CORONAVIRUS 2 (TAT 6-24 HRS): SARS Coronavirus 2: NEGATIVE

## 2019-08-09 ENCOUNTER — Inpatient Hospital Stay (HOSPITAL_COMMUNITY): Payer: Commercial Managed Care - PPO | Admitting: Anesthesiology

## 2019-08-09 ENCOUNTER — Other Ambulatory Visit: Payer: Self-pay

## 2019-08-09 ENCOUNTER — Encounter (HOSPITAL_COMMUNITY): Payer: Self-pay | Admitting: Obstetrics and Gynecology

## 2019-08-09 ENCOUNTER — Inpatient Hospital Stay (HOSPITAL_COMMUNITY): Payer: Commercial Managed Care - PPO

## 2019-08-09 ENCOUNTER — Inpatient Hospital Stay (HOSPITAL_COMMUNITY)
Admission: AD | Admit: 2019-08-09 | Discharge: 2019-08-11 | DRG: 807 | Disposition: A | Discharge status: Home / Self Care | Payer: Commercial Managed Care - PPO | Attending: Obstetrics and Gynecology | Admitting: Obstetrics and Gynecology

## 2019-08-09 DIAGNOSIS — O9902 Anemia complicating childbirth: Secondary | ICD-10-CM | POA: Diagnosis present

## 2019-08-09 DIAGNOSIS — O26893 Other specified pregnancy related conditions, third trimester: Secondary | ICD-10-CM | POA: Diagnosis present

## 2019-08-09 DIAGNOSIS — Z3A40 40 weeks gestation of pregnancy: Secondary | ICD-10-CM

## 2019-08-09 DIAGNOSIS — D649 Anemia, unspecified: Secondary | ICD-10-CM | POA: Diagnosis present

## 2019-08-09 DIAGNOSIS — Z20822 Contact with and (suspected) exposure to covid-19: Secondary | ICD-10-CM | POA: Diagnosis present

## 2019-08-09 DIAGNOSIS — Z87891 Personal history of nicotine dependence: Secondary | ICD-10-CM | POA: Diagnosis not present

## 2019-08-09 DIAGNOSIS — Z3490 Encounter for supervision of normal pregnancy, unspecified, unspecified trimester: Secondary | ICD-10-CM

## 2019-08-09 LAB — CBC
HCT: 31.2 % — ABNORMAL LOW (ref 36.0–46.0)
Hemoglobin: 9.7 g/dL — ABNORMAL LOW (ref 12.0–15.0)
MCH: 24.8 pg — ABNORMAL LOW (ref 26.0–34.0)
MCHC: 31.1 g/dL (ref 30.0–36.0)
MCV: 79.8 fL — ABNORMAL LOW (ref 80.0–100.0)
Platelets: 186 10*3/uL (ref 150–400)
RBC: 3.91 MIL/uL (ref 3.87–5.11)
RDW: 16.2 % — ABNORMAL HIGH (ref 11.5–15.5)
WBC: 8.3 10*3/uL (ref 4.0–10.5)
nRBC: 0 % (ref 0.0–0.2)

## 2019-08-09 LAB — RPR: RPR Ser Ql: NONREACTIVE

## 2019-08-09 LAB — TYPE AND SCREEN
ABO/RH(D): O NEG
Antibody Screen: NEGATIVE

## 2019-08-09 LAB — ABO/RH: ABO/RH(D): O NEG

## 2019-08-09 MED ORDER — OXYTOCIN 40 UNITS IN NORMAL SALINE INFUSION - SIMPLE MED
1.0000 m[IU]/min | INTRAVENOUS | Status: DC
  Administered 2019-08-09: 2 m[IU]/min via INTRAVENOUS
  Filled 2019-08-09: qty 1000

## 2019-08-09 MED ORDER — OXYCODONE-ACETAMINOPHEN 5-325 MG PO TABS: 1.0000 | ORAL_TABLET | ORAL | Status: DC | PRN

## 2019-08-09 MED ORDER — IBUPROFEN 600 MG PO TABS
600.0000 mg | ORAL_TABLET | Freq: Four times a day (QID) | ORAL | Status: DC
  Administered 2019-08-09 – 2019-08-11 (×7): 600 mg via ORAL
  Filled 2019-08-09 (×7): qty 1

## 2019-08-09 MED ORDER — PRENATAL MULTIVITAMIN CH
1.0000 | ORAL_TABLET | Freq: Every day | ORAL | Status: DC
  Administered 2019-08-10: 1 via ORAL
  Filled 2019-08-09: qty 1

## 2019-08-09 MED ORDER — MISOPROSTOL 25 MCG QUARTER TABLET
25.0000 ug | ORAL_TABLET | ORAL | Status: DC | PRN
  Administered 2019-08-09 (×2): 25 ug via VAGINAL
  Filled 2019-08-09 (×2): qty 1

## 2019-08-09 MED ORDER — OXYTOCIN BOLUS FROM INFUSION
500.0000 mL | Freq: Once | INTRAVENOUS | Status: AC
  Administered 2019-08-09: 500 mL via INTRAVENOUS

## 2019-08-09 MED ORDER — ONDANSETRON HCL 4 MG/2ML IJ SOLN: 4.0000 mg | Freq: Four times a day (QID) | INTRAMUSCULAR | Status: DC | PRN

## 2019-08-09 MED ORDER — TERBUTALINE SULFATE 1 MG/ML IJ SOLN: 0.2500 mg | Freq: Once | INTRAMUSCULAR | Status: DC | PRN

## 2019-08-09 MED ORDER — MEDROXYPROGESTERONE ACETATE 150 MG/ML IM SUSP: 150.0000 mg | INTRAMUSCULAR | Status: DC | PRN

## 2019-08-09 MED ORDER — MEASLES, MUMPS & RUBELLA VAC IJ SOLR: 0.5000 mL | Freq: Once | INTRAMUSCULAR | Status: DC

## 2019-08-09 MED ORDER — SOD CITRATE-CITRIC ACID 500-334 MG/5ML PO SOLN: 30.0000 mL | ORAL | Status: DC | PRN

## 2019-08-09 MED ORDER — BISACODYL 10 MG RE SUPP: 10.0000 mg | Freq: Every day | RECTAL | Status: DC | PRN

## 2019-08-09 MED ORDER — FENTANYL-BUPIVACAINE-NACL 0.5-0.125-0.9 MG/250ML-% EP SOLN
12.0000 mL/h | EPIDURAL | Status: DC | PRN
  Filled 2019-08-09: qty 250

## 2019-08-09 MED ORDER — PHENYLEPHRINE 40 MCG/ML (10ML) SYRINGE FOR IV PUSH (FOR BLOOD PRESSURE SUPPORT): 80.0000 ug | PREFILLED_SYRINGE | INTRAVENOUS | Status: DC | PRN

## 2019-08-09 MED ORDER — SODIUM CHLORIDE (PF) 0.9 % IJ SOLN
INTRAMUSCULAR | Status: DC | PRN
  Administered 2019-08-09: 12 mL/h via EPIDURAL

## 2019-08-09 MED ORDER — DIPHENHYDRAMINE HCL 50 MG/ML IJ SOLN: 12.5000 mg | INTRAMUSCULAR | Status: DC | PRN

## 2019-08-09 MED ORDER — ONDANSETRON HCL 4 MG PO TABS: 4.0000 mg | ORAL_TABLET | ORAL | Status: DC | PRN

## 2019-08-09 MED ORDER — TETANUS-DIPHTH-ACELL PERTUSSIS 5-2.5-18.5 LF-MCG/0.5 IM SUSP: 0.5000 mL | Freq: Once | INTRAMUSCULAR | Status: DC

## 2019-08-09 MED ORDER — ONDANSETRON HCL 4 MG/2ML IJ SOLN: 4.0000 mg | INTRAMUSCULAR | Status: DC | PRN

## 2019-08-09 MED ORDER — DIPHENHYDRAMINE HCL 25 MG PO CAPS: 25.0000 mg | ORAL_CAPSULE | Freq: Four times a day (QID) | ORAL | Status: DC | PRN

## 2019-08-09 MED ORDER — ACETAMINOPHEN 325 MG PO TABS: 650.0000 mg | ORAL_TABLET | ORAL | Status: DC | PRN

## 2019-08-09 MED ORDER — OXYCODONE-ACETAMINOPHEN 5-325 MG PO TABS: 2.0000 | ORAL_TABLET | ORAL | Status: DC | PRN

## 2019-08-09 MED ORDER — ACETAMINOPHEN 325 MG PO TABS
650.0000 mg | ORAL_TABLET | ORAL | Status: DC | PRN
  Administered 2019-08-10: 650 mg via ORAL
  Filled 2019-08-09: qty 2

## 2019-08-09 MED ORDER — LIDOCAINE HCL (PF) 1 % IJ SOLN: 30.0000 mL | INTRAMUSCULAR | Status: DC | PRN

## 2019-08-09 MED ORDER — OXYTOCIN 40 UNITS IN NORMAL SALINE INFUSION - SIMPLE MED: 2.5000 [IU]/h | INTRAVENOUS | Status: DC

## 2019-08-09 MED ORDER — BENZOCAINE-MENTHOL 20-0.5 % EX AERO
1.0000 "application " | INHALATION_SPRAY | CUTANEOUS | Status: DC | PRN
  Filled 2019-08-09 (×2): qty 56

## 2019-08-09 MED ORDER — EPHEDRINE 5 MG/ML INJ: 10.0000 mg | INTRAVENOUS | Status: DC | PRN

## 2019-08-09 MED ORDER — LACTATED RINGERS IV SOLN: 500.0000 mL | Freq: Once | INTRAVENOUS | Status: DC

## 2019-08-09 MED ORDER — WITCH HAZEL-GLYCERIN EX PADS: 1.0000 "application " | MEDICATED_PAD | CUTANEOUS | Status: DC | PRN

## 2019-08-09 MED ORDER — ZOLPIDEM TARTRATE 5 MG PO TABS: 5.0000 mg | ORAL_TABLET | Freq: Every evening | ORAL | Status: DC | PRN

## 2019-08-09 MED ORDER — FLUOXETINE HCL 40 MG PO CAPS: 40.0000 mg | ORAL_CAPSULE | Freq: Every day | ORAL | Status: DC

## 2019-08-09 MED ORDER — LACTATED RINGERS IV SOLN: 500.0000 mL | INTRAVENOUS | Status: DC | PRN

## 2019-08-09 MED ORDER — LACTATED RINGERS IV SOLN: INTRAVENOUS | Status: DC

## 2019-08-09 MED ORDER — DIBUCAINE (PERIANAL) 1 % EX OINT: 1.0000 "application " | TOPICAL_OINTMENT | CUTANEOUS | Status: DC | PRN

## 2019-08-09 MED ORDER — FLEET ENEMA 7-19 GM/118ML RE ENEM: 1.0000 | ENEMA | Freq: Every day | RECTAL | Status: DC | PRN

## 2019-08-09 MED ORDER — SERTRALINE HCL 50 MG PO TABS
50.0000 mg | ORAL_TABLET | Freq: Every day | ORAL | Status: DC
  Administered 2019-08-09 – 2019-08-10 (×2): 50 mg via ORAL
  Filled 2019-08-09 (×2): qty 1

## 2019-08-09 MED ORDER — LIDOCAINE HCL (PF) 1 % IJ SOLN
INTRAMUSCULAR | Status: DC | PRN
  Administered 2019-08-09: 10 mL via EPIDURAL

## 2019-08-09 MED ORDER — COCONUT OIL OIL: 1.0000 "application " | TOPICAL_OIL | Status: DC | PRN

## 2019-08-09 MED ORDER — SIMETHICONE 80 MG PO CHEW: 80.0000 mg | CHEWABLE_TABLET | ORAL | Status: DC | PRN

## 2019-08-09 MED ORDER — SENNOSIDES-DOCUSATE SODIUM 8.6-50 MG PO TABS
2.0000 | ORAL_TABLET | ORAL | Status: DC
  Administered 2019-08-09 – 2019-08-10 (×2): 2 via ORAL
  Filled 2019-08-09 (×2): qty 2

## 2019-08-09 NOTE — H&P (Signed)
Debbie Robbins is a 29 y.o. G 1 P 0 at 40 weeks presents for IOL  Received cytotec last night and water broke this morning Asking for epidural OB History    Gravida  1   Para      Term      Preterm      AB      Living        SAB      TAB      Ectopic      Multiple      Live Births             Past Medical History:  Diagnosis Date  . Anxiety   . Depression   . Obesity   . Partial hearing loss, bilateral   . Prediabetes   . Vaginal Pap smear, abnormal    Past Surgical History:  Procedure Laterality Date  . WISDOM TOOTH EXTRACTION     Family History: family history includes Anemia in her mother; Cancer in her maternal grandmother; Diabetes in her maternal grandmother, paternal grandfather, and paternal grandmother; Heart disease in her paternal grandfather. Social History:  reports that she has quit smoking. She has never used smokeless tobacco. She reports current alcohol use. She reports that she does not use drugs.     Maternal Diabetes: No Genetic Screening: Normal Maternal Ultrasounds/Referrals: Normal Fetal Ultrasounds or other Referrals:  None Maternal Substance Abuse:  No Significant Maternal Medications:  None Significant Maternal Lab Results:  None Other Comments:  None  Review of Systems  All other systems reviewed and are negative.  History Dilation: 4 Effacement (%): 80 Station: -1 Exam by:: Dr. Vincente Poli Blood pressure (!) 151/83, pulse (!) 115, temperature 98.1 F (36.7 C), temperature source Oral, resp. rate 16, height 5\' 9"  (1.753 m), weight 98.4 kg, last menstrual period 11/02/2018. Maternal Exam:  Uterine Assessment: Contraction strength is moderate.  Contraction frequency is regular.   Abdomen: Fetal presentation: vertex     Fetal Exam Fetal State Assessment: Category I - tracings are normal.     Physical Exam  Nursing note and vitals reviewed. Constitutional: She appears well-developed and well-nourished.  HENT:  Head:  Normocephalic and atraumatic.  Eyes: Pupils are equal, round, and reactive to light.  Cardiovascular: Normal rate and regular rhythm.  Respiratory: Effort normal.  GI: Soft.  Musculoskeletal:     Cervical back: Normal range of motion.    Prenatal labs: ABO, Rh: --/--/O NEG, O NEG Performed at [facility], [address], [place], [place]  [phone]/03 0020) Antibody: NEG (03/03 0020) Rubella: Immune (07/17 0000) RPR: Nonreactive (07/17 0000)  HBsAg: Negative (07/17 0000)  HIV: Non-reactive (07/17 0000)  GBS: Negative/-- (02/26 0000)   Assessment/Plan: IUP at term  IOL Epidural Pitocin prn  Anticipate NSVD   12-26-1982 08/09/2019, 7:44 AM

## 2019-08-09 NOTE — Anesthesia Procedure Notes (Signed)
Epidural Patient location during procedure: OB Start time: 08/09/2019 8:36 AM End time: 08/09/2019 8:47 AM  Staffing Anesthesiologist: Lucretia Kern, MD Performed: anesthesiologist   Preanesthetic Checklist Completed: patient identified, IV checked, risks and benefits discussed, monitors and equipment checked, pre-op evaluation and timeout performed  Epidural Patient position: sitting Prep: DuraPrep Patient monitoring: heart rate, continuous pulse ox and blood pressure Approach: midline Location: L3-L4 Injection technique: LOR air  Needle:  Needle type: Tuohy  Needle gauge: 17 G Needle length: 9 cm Needle insertion depth: 5 cm Catheter type: closed end flexible Catheter size: 19 Gauge Catheter at skin depth: 10 cm Test dose: negative  Assessment Events: blood not aspirated, injection not painful, no injection resistance, no paresthesia and negative IV test  Additional Notes Reason for block:procedure for pain

## 2019-08-09 NOTE — Anesthesia Preprocedure Evaluation (Signed)
Anesthesia Evaluation  Patient identified by MRN, date of birth, ID band Patient awake    Reviewed: Allergy & Precautions, H&P , NPO status , Patient's Chart, lab work & pertinent test results  History of Anesthesia Complications Negative for: history of anesthetic complications  Airway Mallampati: II  TM Distance: >3 FB Neck ROM: full    Dental no notable dental hx.    Pulmonary neg pulmonary ROS, former smoker,    Pulmonary exam normal        Cardiovascular negative cardio ROS Normal cardiovascular exam Rhythm:regular Rate:Normal     Neuro/Psych PSYCHIATRIC DISORDERS Anxiety Depression negative neurological ROS     GI/Hepatic negative GI ROS, Neg liver ROS,   Endo/Other  negative endocrine ROS  Renal/GU negative Renal ROS  negative genitourinary   Musculoskeletal   Abdominal   Peds  Hematology  (+) Blood dyscrasia, anemia ,   Anesthesia Other Findings   Reproductive/Obstetrics (+) Pregnancy                             Anesthesia Physical Anesthesia Plan  ASA: II  Anesthesia Plan: Epidural   Post-op Pain Management:    Induction:   PONV Risk Score and Plan:   Airway Management Planned:   Additional Equipment:   Intra-op Plan:   Post-operative Plan:   Informed Consent: I have reviewed the patients History and Physical, chart, labs and discussed the procedure including the risks, benefits and alternatives for the proposed anesthesia with the patient or authorized representative who has indicated his/her understanding and acceptance.       Plan Discussed with:   Anesthesia Plan Comments:         Anesthesia Quick Evaluation

## 2019-08-10 LAB — CBC
HCT: 28.5 % — ABNORMAL LOW (ref 36.0–46.0)
Hemoglobin: 8.6 g/dL — ABNORMAL LOW (ref 12.0–15.0)
MCH: 24 pg — ABNORMAL LOW (ref 26.0–34.0)
MCHC: 30.2 g/dL (ref 30.0–36.0)
MCV: 79.4 fL — ABNORMAL LOW (ref 80.0–100.0)
Platelets: 143 10*3/uL — ABNORMAL LOW (ref 150–400)
RBC: 3.59 MIL/uL — ABNORMAL LOW (ref 3.87–5.11)
RDW: 15.9 % — ABNORMAL HIGH (ref 11.5–15.5)
WBC: 10.3 10*3/uL (ref 4.0–10.5)
nRBC: 0 % (ref 0.0–0.2)

## 2019-08-10 LAB — RUBELLA SCREEN: Rubella: 1.55 index (ref >0.99)

## 2019-08-10 MED ORDER — RHO D IMMUNE GLOBULIN 1500 UNIT/2ML IJ SOSY
300.0000 ug | PREFILLED_SYRINGE | Freq: Once | INTRAMUSCULAR | Status: AC
  Administered 2019-08-10: 300 ug via INTRAVENOUS
  Filled 2019-08-10: qty 2

## 2019-08-10 NOTE — Progress Notes (Signed)
[name] was referred for history of depression/anxiety.  * Referral screened out by Clinical Social Worker because none of the following criteria appear to apply:  ~ History of anxiety/depression during this pregnancy, or of post-partum depression following prior delivery. ~ Diagnosis of anxiety and/or depression within last 3 years OR * [name]'s symptoms currently being treated with medication and/or therapy. [name] currently taking Zoloft.   Please contact the Clinical Social Worker if needs arise, by [name] request, or if [name] scores greater than 9/yes to question 10 on Edinburgh Postpartum Depression Screen.  Debbie Blunck, LCSW [facility] [phone]  

## 2019-08-10 NOTE — Lactation Note (Signed)
This note was copied from a baby's chart. Lactation Consultation Note Mom resting, would like LC to come back when baby is feeding. Mom stated baby BF last for 40 minutes off and on.  Asked mom to call [facility] for assistance for next feeding.   Patient Name: Boy Mystique Bjelland [id number] Date: 08/10/2019     Maternal Data    Feeding Feeding Type: Breast Fed  LATCH Score                   Interventions    Lactation Tools Discussed/Used     Consult Status      Clearence Vitug, Diamond Nickel 08/10/2019, 2:52 AM

## 2019-08-10 NOTE — Lactation Note (Addendum)
This note was copied from a baby's chart. Lactation Consultation Note  Patient Name: Debbie Robbins Date: 08/10/2019 Reason for consult: Initial assessment;Primapara;1st time breastfeeding;Infant weight loss;Other (Comment);Term(3 %  weight loss / post circ)  Baby is 23 hours old , and has LC entered the room dad lying  On the couch with the baby lying on his chest and mom sitting up in bed.  Per mom the baby last fed attempted at 1100 for on and off over 30 mins .  LC noted after consult per doc flow sheets another attempted feeding 1240.  LC offered to review hand expressing and mom did it well . LC noted areola semi compressible due to edema, with hand expressing and reverse pressure did help  To make the areola compressible enough to latch on the left breast modified  Laid back and back / baby fed for 9 mins with swallows which increased with moist heat and compressions. Per mom comfortable. When baby released the areola more compressible.  As a preventive measure - LC recommended shells between feedings except when sleeping. LC instructed mom on the shells and hand pump.  Prior to every feeding until areola edema is better - moist heat , breast massage , hand express, pre - pump to prime the milk ducts and when latching work with the  Baby to open wide and that latch with breast compressions until swallows and then  Intermittent.  LC reviewed potential feeding behavior post circ.  Per mom still has to contact her insurance company regarding her DEBP.  LC provided the [facility] pamphlet with phone numbers and discussed he  Virtual BF support group.      Maternal Data Has patient been taught Hand Expression?: Yes Does the patient have breastfeeding experience prior to this delivery?: No  Feeding Feeding Type: Breast Fed  LATCH Score Latch: Repeated attempts needed to sustain latch, nipple held in mouth throughout feeding, stimulation needed to elicit sucking  reflex.  Audible Swallowing: A few with stimulation  Type of Nipple: Everted at rest and after stimulation  Comfort (Breast/Nipple): Soft / non-tender  Hold (Positioning): Assistance needed to correctly position infant at breast and maintain latch.  LATCH Score: 7  Interventions Interventions: Breast feeding basics reviewed;Assisted with latch;Skin to skin;Breast massage;Hand express;Reverse pressure;Breast compression;Adjust position;Support pillows;Position options  Lactation Tools Discussed/Used Tools: Shells;Pump Shell Type: Inverted Breast pump type: Manual Pump Review: Setup, frequency, and cleaning;Milk Storage Initiated by:: MAI Date initiated:: 08/10/19   Consult Status Consult Status: Follow-up Date: 08/11/19 Follow-up type: In-patient    Debbie Robbins 08/10/2019, 2:40 PM

## 2019-08-10 NOTE — Progress Notes (Signed)
Post Partum Day 1 Subjective: no complaints, up ad lib, voiding, tolerating PO and + flatus  Objective: Blood pressure 123/81, pulse 84, temperature 98.4 F (36.9 C), temperature source Oral, resp. rate 18, height 5\' 9"  (1.753 m), weight 98.4 kg, last menstrual period 11/02/2018, SpO2 100 %, unknown if currently breastfeeding.  Physical Exam:  General: alert, cooperative and no distress Lochia: appropriate Uterine Fundus: firm Incision: healing well DVT Evaluation: No evidence of DVT seen on physical exam.  Recent Labs    08/09/19 0021 08/10/19 0609  HGB 9.7* 8.6*  HCT 31.2* 28.5*    Assessment/Plan: Plan for discharge tomorrow   LOS: 1 day   10/10/19 II 08/10/2019, 7:40 AM

## 2019-08-10 NOTE — Anesthesia Postprocedure Evaluation (Signed)
Anesthesia Post Note  Patient: Debbie Robbins  Procedure(s) Performed: AN AD HOC LABOR EPIDURAL     Patient location during evaluation: Mother Baby Anesthesia Type: Epidural Level of consciousness: awake and alert Pain management: pain level controlled Vital Signs Assessment: post-procedure vital signs reviewed and stable Respiratory status: spontaneous breathing, nonlabored ventilation and respiratory function stable Cardiovascular status: stable Postop Assessment: no headache, no backache and epidural receding Anesthetic complications: no    Last Vitals:  Vitals:   08/10/19 0317 08/10/19 0616  BP: 118/76 123/81  Pulse: 78 84  Resp: 16 18  Temp: 37.2 C 36.9 C  SpO2: 100% 100%    Last Pain:  Vitals:   08/10/19 0616  TempSrc: Oral  PainSc: 0-No pain   Pain Goal:                   [employer]

## 2019-08-11 LAB — RH IG WORKUP (INCLUDES ABO/RH)
ABO/RH(D): O NEG
Fetal Screen: NEGATIVE
Gestational Age(Wks): 40
Unit division: 0

## 2019-08-11 NOTE — Lactation Note (Signed)
This note was copied from a baby's chart. Lactation Consultation Note  Patient Name: Debbie Robbins Date: 08/11/2019 Reason for consult: Follow-up assessment;1st time breastfeeding;Primapara;Infant weight loss  Baby is 76 hours old  Bethann Humble NP requested for [facility] to check latch and baby is feeding,.  When LC entered the room baby had been feeding and released.  LC recommended to stimulate the baby and re-latch for assessment. Cross cradle Use and mom seems to be comfortable with using this position.  Baby does have a recessed chin and after baby latched , LC showed dad how to ease Chin and checked to make sure the lips were flanged position.  Baby sluggish at 1st and with moist heat applied above the baby's mouth increased swallows noted and was still feeding at 10 mins.  LC reviewed basics of latching , stressed the importance of STS feedings until the baby is back to birth weight, gaining steadily and can stay awake for majority of feeding.  Discussed nutritive vs non - nutritive feeding patterns and the importance of watching the baby for hanging out latched.  Mom has shells , coconut oil and hand pump.  LC recommended prior to every feeding due to 7 % weight loss , moist warm compress, good massage, hand express, prepump to prime the milk ducts and  Latch with firm support.  EBM can be spoon fed back to baby.  LC reported to Bethann Humble the Latch score had increased 9 .    Maternal Data Has patient been taught Hand Expression?: Yes  Feeding Feeding Type: Breast Fed  LATCH Score Latch: Grasps breast easily, tongue down, lips flanged, rhythmical sucking.  Audible Swallowing: Spontaneous and intermittent  Type of Nipple: Everted at rest and after stimulation  Comfort (Breast/Nipple): Soft / non-tender  Hold (Positioning): Assistance needed to correctly position infant at breast and maintain latch.  LATCH Score: 9  Interventions Interventions: Breast feeding  basics reviewed;Assisted with latch;Skin to skin;Breast massage;Hand express;Breast compression;Adjust position;Support pillows;Position options  Lactation Tools Discussed/Used Tools: Shells;Pump Flange Size: 24;27 Shell Type: Inverted Breast pump type: Manual Pump Review: Milk Storage   Consult Status Consult Status: Complete Date: 08/11/19    Kathrin Greathouse 08/11/2019, 11:37 AM

## 2019-08-11 NOTE — Lactation Note (Signed)
This note was copied from a baby's chart. Lactation Consultation Note  Patient Name: Boy Tecia Cinnamon QUCLT'V Date: 08/11/2019 Reason for consult: Follow-up assessment;Primapara;1st time breastfeeding;Term;Infant weight loss;Other (Comment)(7 % weight loss) Baby is 41 hours old / at # 37 hours bili check - 4.8.  As LC entered the room , baby was just finishing at breast. Nipple well rounded.  Nipples appear healthy and areola more compressible today.  LC encouraged mom to continue breast shells between feedings except for sleep.  Mom has her hand pump and #24 F and #82F.  Sore nipple and engorgement prevention and tx reviewed.  Mom has the [facility] pamphlet with phone numbers.    Maternal Data    Feeding Feeding Type: (baby last fed at 7:30 am)  LATCH Score                   Interventions Interventions: Breast feeding basics reviewed  Lactation Tools Discussed/Used Tools: Shells;Pump;Flanges Flange Size: 24;27 Shell Type: Inverted Breast pump type: Manual Pump Review: Milk Storage   Consult Status Consult Status: Complete Date: 08/11/19    Kathrin Greathouse 08/11/2019, 8:40 AM

## 2019-08-11 NOTE — Discharge Summary (Signed)
Obstetric Discharge Summary Reason for Admission: IOL Prenatal Procedures: none Intrapartum Procedures: spontaneous vaginal delivery Postpartum Procedures: none Complications-Operative and Postpartum: ist degree lac Hemoglobin  Date Value Ref Range Status  08/10/2019 8.6 (L) 12.0 - 15.0 g/dL Final   HCT  Date Value Ref Range Status  08/10/2019 28.5 (L) 36.0 - 46.0 % Final    Physical Exam:  General: alert, cooperative, appears stated age and no distress Lochia: appropriate Uterine Fundus: firm Incision: healing well DVT Evaluation: No evidence of DVT seen on physical exam.  Discharge Diagnoses: Term Pregnancy-delivered  Discharge Information: Date: 08/11/2019 Activity: pelvic rest Diet: routine Medications: PNV and Iron Condition: stable Instructions: refer to practice specific booklet Discharge to: home   Newborn Data: Live born female  Birth Weight: 9 lb 10.3 oz (4375 g) APGAR: 8, 9  Newborn Delivery   Birth date/time: 08/09/2019 15:27:00 Delivery type: Vaginal, Spontaneous      Home with mother.  Debbie Robbins 08/11/2019, 8:57 AM

## 2020-02-07 DIAGNOSIS — B342 Coronavirus infection, unspecified: Secondary | ICD-10-CM

## 2020-02-07 HISTORY — DX: Coronavirus infection, unspecified: B34.2

## 2020-04-04 ENCOUNTER — Other Ambulatory Visit: Payer: Self-pay | Admitting: Obstetrics and Gynecology

## 2020-04-04 DIAGNOSIS — N644 Mastodynia: Secondary | ICD-10-CM

## 2020-05-07 ENCOUNTER — Other Ambulatory Visit: Payer: Self-pay

## 2020-05-07 ENCOUNTER — Ambulatory Visit
Admission: RE | Admit: 2020-05-07 | Discharge: 2020-05-07 | Disposition: A | Discharge status: Home / Self Care | Payer: Commercial Managed Care - PPO | Source: Ambulatory Visit | Attending: Obstetrics and Gynecology | Admitting: Obstetrics and Gynecology

## 2020-05-07 DIAGNOSIS — N644 Mastodynia: Secondary | ICD-10-CM

## 2020-08-06 DIAGNOSIS — E559 Vitamin D deficiency, unspecified: Secondary | ICD-10-CM

## 2020-08-06 HISTORY — DX: Vitamin D deficiency, unspecified: E55.9

## 2020-08-07 ENCOUNTER — Ambulatory Visit: Payer: Commercial Managed Care - PPO | Admitting: Family Medicine

## 2020-08-07 ENCOUNTER — Other Ambulatory Visit: Payer: Self-pay

## 2020-08-07 ENCOUNTER — Encounter: Payer: Self-pay | Admitting: Family Medicine

## 2020-08-07 VITALS — BP 138/82 | HR 81 | Temp 98.4°F | Ht 67.5 in | Wt 209.0 lb

## 2020-08-07 DIAGNOSIS — H9393 Unspecified disorder of ear, bilateral: Secondary | ICD-10-CM

## 2020-08-07 DIAGNOSIS — F419 Anxiety disorder, unspecified: Secondary | ICD-10-CM | POA: Diagnosis not present

## 2020-08-07 DIAGNOSIS — Z0000 Encounter for general adult medical examination without abnormal findings: Secondary | ICD-10-CM | POA: Diagnosis not present

## 2020-08-07 DIAGNOSIS — F32A Depression, unspecified: Secondary | ICD-10-CM

## 2020-08-07 DIAGNOSIS — H9193 Unspecified hearing loss, bilateral: Secondary | ICD-10-CM

## 2020-08-07 DIAGNOSIS — Z7689 Persons encountering health services in other specified circumstances: Secondary | ICD-10-CM

## 2020-08-07 DIAGNOSIS — Z09 Encounter for follow-up examination after completed treatment for conditions other than malignant neoplasm: Secondary | ICD-10-CM

## 2020-08-07 MED ORDER — BUSPIRONE HCL 10 MG PO TABS: 10.0000 mg | ORAL_TABLET | Freq: Three times a day (TID) | ORAL | 6 refills | Status: DC

## 2020-08-07 MED ORDER — FLUOXETINE HCL 40 MG PO CAPS: 40.0000 mg | ORAL_CAPSULE | Freq: Every day | ORAL | 6 refills | Status: DC

## 2020-08-07 NOTE — Patient Instructions (Signed)
[email or web address]">  Generalized Anxiety Disorder, Adult Generalized anxiety disorder (GAD) is a mental health condition. Unlike normal worries, anxiety related to GAD is not triggered by a specific event. These worries do not fade or get better with time. GAD interferes with relationships, work, and school. GAD symptoms can vary from mild to severe. People with severe GAD can have intense waves of anxiety with physical symptoms that are similar to panic attacks. What are the causes? The exact cause of GAD is not known, but the following are believed to have an impact:  Differences in natural brain chemicals.  Genes passed down from parents to children.  Differences in the way threats are perceived.  Development during childhood.  Personality. What increases the risk? The following factors may make you more likely to develop this condition:  Being female.  Having a family history of anxiety disorders.  Being very shy.  Experiencing very stressful life events, such as the death of a loved one.  Having a very stressful family environment. What are the signs or symptoms? People with GAD often worry excessively about many things in their lives, such as their health and family. Symptoms may also include:  Mental and emotional symptoms: ? Worrying excessively about natural disasters. ? Fear of being late. ? Difficulty concentrating. ? Fears that others are judging your performance.  Physical symptoms: ? Fatigue. ? Headaches, muscle tension, muscle twitches, trembling, or feeling shaky. ? Feeling like your heart is pounding or beating very fast. ? Feeling out of breath or like you cannot take a deep breath. ? Having trouble falling asleep or staying asleep, or experiencing restlessness. ? Sweating. ? Nausea, diarrhea, or irritable bowel syndrome (IBS).  Behavioral symptoms: ? Experiencing erratic moods or irritability. ? Avoidance of new situations. ? Avoidance of  people. ? Extreme difficulty making decisions. How is this diagnosed? This condition is diagnosed based on your symptoms and medical history. You will also have a physical exam. Your health care provider may perform tests to rule out other possible causes of your symptoms. To be diagnosed with GAD, a person must have anxiety that:  Is out of his or her control.  Affects several different aspects of his or her life, such as work and relationships.  Causes distress that makes him or her unable to take part in normal activities.  Includes at least three symptoms of GAD, such as restlessness, fatigue, trouble concentrating, irritability, muscle tension, or sleep problems. Before your health care provider can confirm a diagnosis of GAD, these symptoms must be present more days than they are not, and they must last for 6 months or longer. How is this treated? This condition may be treated with:  Medicine. Antidepressant medicine is usually prescribed for long-term daily control. Anti-anxiety medicines may be added in severe cases, especially when panic attacks occur.  Talk therapy (psychotherapy). Certain types of talk therapy can be helpful in treating GAD by providing support, education, and guidance. Options include: ? Cognitive behavioral therapy (CBT). People learn coping skills and self-calming techniques to ease their physical symptoms. They learn to identify unrealistic thoughts and behaviors and to replace them with more appropriate thoughts and behaviors. ? Acceptance and commitment therapy (ACT). This treatment teaches people how to be mindful as a way to cope with unwanted thoughts and feelings. ? Biofeedback. This process trains you to manage your body's response (physiological response) through breathing techniques and relaxation methods. You will work with a therapist while machines are used to monitor your physical   symptoms.  Stress management techniques. These include yoga,  meditation, and exercise. A mental health specialist can help determine which treatment is best for you. Some people see improvement with one type of therapy. However, other people require a combination of therapies.   Follow these instructions at home: Lifestyle  Maintain a consistent routine and schedule.  Anticipate stressful situations. Create a plan, and allow extra time to work with your plan.  Practice stress management or self-calming techniques that you have learned from your therapist or your health care provider. General instructions  Take over-the-counter and prescription medicines only as told by your health care provider.  Understand that you are likely to have setbacks. Accept this and be kind to yourself as you persist to take better care of yourself.  Recognize and accept your accomplishments, even if you judge them as small.  Keep all follow-up visits as told by your health care provider. This is important. Contact a health care provider if:  Your symptoms do not get better.  Your symptoms get worse.  You have signs of depression, such as: ? A persistently sad or irritable mood. ? Loss of enjoyment in activities that used to bring you joy. ? Change in weight or eating. ? Changes in sleeping habits. ? Avoiding friends or family members. ? Loss of energy for normal tasks. ? Feelings of guilt or worthlessness. Get help right away if:  You have serious thoughts about hurting yourself or others. If you ever feel like you may hurt yourself or others, or have thoughts about taking your own life, get help right away. Go to your nearest emergency department or:  Call your local emergency services (911 in the [place]  Call a suicide crisis helpline, such as the National Suicide Prevention Lifeline at 978-540-5897. This is open 24 hours a day in the [place]  Text the Crisis Text Line at 520-366-5865 (in the [place] Summary  Generalized anxiety disorder (GAD) is a mental  health condition that involves worry that is not triggered by a specific event.  People with GAD often worry excessively about many things in their lives, such as their health and family.  GAD may cause symptoms such as restlessness, trouble concentrating, sleep problems, frequent sweating, nausea, diarrhea, headaches, and trembling or muscle twitching.  A mental health specialist can help determine which treatment is best for you. Some people see improvement with one type of therapy. However, other people require a combination of therapies. This information is not intended to replace advice given to you by your health care provider. Make sure you discuss any questions you have with your health care provider. Document Revised: 03/15/2019 Document Reviewed: 03/15/2019 Elsevier Patient Education  2021 Elsevier Inc. Buspirone tablets What is this medicine? BUSPIRONE (byoo SPYE rone) is used to treat anxiety disorders. This medicine may be used for other purposes; ask your health care provider or pharmacist if you have questions. COMMON BRAND NAME(S): BuSpar What should I tell my health care provider before I take this medicine? They need to know if you have any of these conditions:  kidney or liver disease  an unusual or allergic reaction to buspirone, other medicines, foods, dyes, or preservatives  pregnant or trying to get pregnant  breast-feeding How should I use this medicine? Take this medicine by mouth with a glass of water. Follow the directions on the prescription label. You may take this medicine with or without food. To ensure that this medicine always works the same way for you, you  should take it either always with or always without food. Take your doses at regular intervals. Do not take your medicine more often than directed. Do not stop taking except on the advice of your doctor or health care professional. Talk to your pediatrician regarding the use of this medicine in  children. Special care may be needed. Overdosage: If you think you have taken too much of this medicine contact a poison control center or emergency room at once. NOTE: This medicine is only for you. Do not share this medicine with others. What if I miss a dose? If you miss a dose, take it as soon as you can. If it is almost time for your next dose, take only that dose. Do not take double or extra doses. What may interact with this medicine? Do not take this medicine with any of the following medications:  linezolid  MAOIs like Carbex, Eldepryl, Marplan, Nardil, and Parnate  methylene blue  procarbazine This medicine may also interact with the following medications:  diazepam  digoxin  diltiazem  erythromycin  grapefruit juice  haloperidol  medicines for mental depression or mood problems  medicines for seizures like carbamazepine, phenobarbital and phenytoin  nefazodone  other medications for anxiety  rifampin  ritonavir  some antifungal medicines like itraconazole, ketoconazole, and voriconazole  verapamil  warfarin This list may not describe all possible interactions. Give your health care provider a list of all the medicines, herbs, non-prescription drugs, or dietary supplements you use. Also tell them if you smoke, drink alcohol, or use illegal drugs. Some items may interact with your medicine. What should I watch for while using this medicine? Visit your doctor or health care professional for regular checks on your progress. It may take 1 to 2 weeks before your anxiety gets better. You may get drowsy or dizzy. Do not drive, use machinery, or do anything that needs mental alertness until you know how this drug affects you. Do not stand or sit up quickly, especially if you are an older patient. This reduces the risk of dizzy or fainting spells. Alcohol can make you more drowsy and dizzy. Avoid alcoholic drinks. What side effects may I notice from receiving this  medicine? Side effects that you should report to your doctor or health care professional as soon as possible:  blurred vision or other vision changes  chest pain  confusion  difficulty breathing  feelings of hostility or anger  muscle aches and pains  numbness or tingling in hands or feet  ringing in the ears  skin rash and itching  vomiting  weakness Side effects that usually do not require medical attention (report to your doctor or health care professional if they continue or are bothersome):  disturbed dreams, nightmares  headache  nausea  restlessness or nervousness  sore throat and nasal congestion  stomach upset This list may not describe all possible side effects. Call your doctor for medical advice about side effects. You may report side effects to FDA at 1-800-FDA-1088. Where should I keep my medicine? Keep out of the reach of children. Store at room temperature below 30 degrees C (86 degrees F). Protect from light. Keep container tightly closed. Throw away any unused medicine after the expiration date. NOTE: This sheet is a summary. It may not cover all possible information. If you have questions about this medicine, talk to your doctor, pharmacist, or health care provider.  2021 Elsevier/Gold Standard (2010-01-02 18:06:11)

## 2020-08-07 NOTE — Progress Notes (Signed)
Patient [facility]   Re-Established Care  Subjective:  Patient ID: Debbie Robbins, female    DOB: September 25, 1990  Age: 30 y.o. MRN: [id number]  CC:  Chief Complaint  Patient presents with  . New Patient (Initial Visit)    Est care, discuss getting back on medication depression    HPI Debbie Robbins is a 30 year old female who presents to re-establish care today.    Patient Active Problem List   Diagnosis Date Noted  . Pregnancy 08/09/2019  . NSVD (normal spontaneous vaginal delivery) 08/09/2019  . Rectal bleeding 12/17/2014  . Increased urinary frequency 12/17/2014  . Obesity   . Prediabetes    Current Status: This will be her initial office visit with me. She was previously seeing Lanae Boast, NP for her PCP needs. Since her last office visit, she is doing well with no complaints. Her anxiety is moderate today r/t life, personal, and family issues. She is currently in counseling at [facility]. She denies suicidal ideations, homicidal ideations, or auditory hallucinations. She denies fevers, chills, fatigue, recent infections, weight loss, and night sweats. She was previously prescribed Fluoxetine. She has not had any headaches, visual changes, dizziness, and falls. No chest pain, heart palpitations, cough and shortness of breath reported. No reports of GI problems such as nausea, vomiting, diarrhea, and constipation. She has no reports of blood in stools, dysuria and hematuria. She is taking all medications as prescribed. She denies pain today.   Past Medical History:  Diagnosis Date  . Anxiety   . Coronavirus infection 02/2020  . Depression   . Obesity   . Partial hearing loss, bilateral   . Prediabetes   . Vaginal Pap smear, abnormal     Past Surgical History:  Procedure Laterality Date  . WISDOM TOOTH EXTRACTION      Family History  Problem Relation Age of Onset  . Anemia Mother   . Cancer Maternal Grandmother         ovarian  . Diabetes Maternal Grandmother   . Diabetes Paternal Grandmother   . Diabetes Paternal Grandfather   . Heart disease Paternal Grandfather        heart attack    Social History   Socioeconomic History  . Marital status: Single    Spouse name: Not on file  . Number of children: Not on file  . Years of education: Not on file  . Highest education level: Not on file  Occupational History  . Not on file  Tobacco Use  . Smoking status: Former Research scientist (life sciences)  . Smokeless tobacco: Never Used  Vaping Use  . Vaping Use: Every day  Substance and Sexual Activity  . Alcohol use: Yes    Comment: socially  . Drug use: No  . Sexual activity: Yes    Birth control/protection: Condom, Pill  Other Topics Concern  . Not on file  Social History Narrative  . Not on file   Social Determinants of Health   Financial Resource Strain: Not on file  Food Insecurity: Not on file  Transportation Needs: Not on file  Physical Activity: Not on file  Stress: Not on file  Social Connections: Not on file  Intimate Partner Violence: Not on file    Outpatient Medications Prior to Visit  Medication Sig Dispense Refill  . FLUoxetine (PROZAC) 40 MG capsule Take 1 capsule (40 mg total) by mouth daily. 30 capsule 3  . hydrOXYzine (VISTARIL) 25 MG capsule Take  1-2 capsules by mouth at bedtime PRN 60 capsule 2  . Prenatal Vit-Fe Fumarate-FA (PRENATAL MULTIVITAMIN) TABS tablet Take 1 tablet by mouth daily at 12 noon.    . propranolol ER (INDERAL LA) 60 MG 24 hr capsule Take 1 capsule (60 mg total) by mouth daily for 30 days. 30 capsule 5  . sertraline (ZOLOFT) 50 MG tablet Take 50 mg by mouth daily.    Marland Kitchen triamcinolone ointment (KENALOG) 0.5 % Apply 1 application topically 2 (two) times daily. 30 g 0   No facility-administered medications prior to visit.    Allergies  Allergen Reactions  . Strawberry (Diagnostic)     ROS Review of Systems  Constitutional: Negative.   HENT: Positive for hearing loss  (bilateral ).   Eyes: Negative.   Respiratory: Negative.   Cardiovascular: Negative.   Gastrointestinal: Negative.   Endocrine: Negative.   Genitourinary: Negative.   Musculoskeletal: Negative.   Skin: Negative.   Allergic/Immunologic: Negative.   Neurological: Negative.   Hematological: Negative.   Psychiatric/Behavioral: The patient is nervous/anxious.       Objective:    Physical Exam Vitals and nursing note reviewed.  Constitutional:      Appearance: Normal appearance.  HENT:     Head: Normocephalic and atraumatic.     Nose: Nose normal.     Mouth/Throat:     Mouth: Mucous membranes are moist.     Pharynx: Oropharynx is clear.  Cardiovascular:     Rate and Rhythm: Normal rate and regular rhythm.     Pulses: Normal pulses.     Heart sounds: Normal heart sounds.  Pulmonary:     Effort: Pulmonary effort is normal.     Breath sounds: Normal breath sounds.  Abdominal:     General: Bowel sounds are normal.     Palpations: Abdomen is soft.  Musculoskeletal:        General: Normal range of motion.     Cervical back: Normal range of motion and neck supple.  Skin:    General: Skin is warm and dry.  Neurological:     General: No focal deficit present.     Mental Status: She is alert and oriented to person, place, and time.  Psychiatric:        Mood and Affect: Mood normal.        Behavior: Behavior normal.        Thought Content: Thought content normal.        Judgment: Judgment normal.     BP 138/82 (BP Location: Left Arm, Patient Position: Sitting, Cuff Size: Normal)   Pulse 81   Temp 98.4 F (36.9 C) (Temporal)   Ht 5' 7.5" (1.715 m)   Wt 209 lb (94.8 kg)   LMP 07/23/2020   BMI 32.25 kg/m  Wt Readings from Last 3 Encounters:  08/07/20 209 lb (94.8 kg)  08/09/19 216 lb 14.9 oz (98.4 kg)  11/24/18 217 lb (98.4 kg)     Health Maintenance Due  Topic Date Due  . Hepatitis C Screening  Never done  . COVID-19 Vaccine (1) Never done  . INFLUENZA VACCINE   01/07/2020    There are no preventive care reminders to display for this patient.  No results found for: TSH Lab Results  Component Value Date   WBC 10.3 08/10/2019   HGB 8.6 (L) 08/10/2019   HCT 28.5 (L) 08/10/2019   MCV 79.4 (L) 08/10/2019   PLT 143 (L) 08/10/2019   No results found for: NA, K,  CHLORIDE, CO2, GLUCOSE, BUN, CREATININE, BILITOT, ALKPHOS, AST, ALT, PROT, ALBUMIN, CALCIUM, ANIONGAP, EGFR, GFR No results found for: CHOL No results found for: HDL No results found for: LDLCALC No results found for: TRIG No results found for: CHOLHDL No results found for: HGBA1C    Assessment & Plan:   1. Encounter to establish care  2. Annual physical exam Physical assessment within normal for age. Basic Neurology assessment normal.  Recommend monthly self breast exam Recommend daily multivitamin for women Recommend strength training in 150 minutes of cardiovascular exercise per week  3. Bilateral hearing loss, unspecified hearing loss type - Ambulatory referral to ENT  4. Anxiety We will initiate Buspirone today. She will continue follow ups with Psychiatry as needed.  - busPIRone (BUSPAR) 10 MG tablet; Take 1 tablet (10 mg total) by mouth 3 (three) times daily.  Dispense: 60 tablet; Refill: 6  5. Depression, unspecified depression type We will re-initiate Fluoxetine today.  - FLUoxetine (PROZAC) 40 MG capsule; Take 1 capsule (40 mg total) by mouth daily.  Dispense: 30 capsule; Refill: 6  6. Healthcare maintenance - CBC with Differential - Comprehensive metabolic panel; Future - TSH - Vitamin B12 - Vitamin D, 25-hydroxy - Hemoglobin A1c  7. Ear problem, bilateral - Ambulatory referral to ENT   8. Follow up She will follow up in 6 months.    Meds ordered this encounter  Medications  . FLUoxetine (PROZAC) 40 MG capsule    Sig: Take 1 capsule (40 mg total) by mouth daily.    Dispense:  30 capsule    Refill:  6  . busPIRone (BUSPAR) 10 MG tablet    Sig:  Take 1 tablet (10 mg total) by mouth 3 (three) times daily.    Dispense:  60 tablet    Refill:  6   Orders Placed This Encounter  Procedures  . CBC with Differential  . Comprehensive metabolic panel  . TSH  . Vitamin B12  . Vitamin D, 25-hydroxy  . Hemoglobin A1c  . Ambulatory referral to ENT     Referral Orders     Ambulatory referral to ENT  Kathe Becton, MSN, ANE, FNP-BC [facility] [address] [place], [place] [address] [phone]- fax   Meds ordered this encounter  Medications  . FLUoxetine (PROZAC) 40 MG capsule    Sig: Take 1 capsule (40 mg total) by mouth daily.    Dispense:  30 capsule    Refill:  6  . busPIRone (BUSPAR) 10 MG tablet    Sig: Take 1 tablet (10 mg total) by mouth 3 (three) times daily.    Dispense:  60 tablet    Refill:  6    Orders Placed This Encounter  Procedures  . CBC with Differential  . Comprehensive metabolic panel  . TSH  . Vitamin B12  . Vitamin D, 25-hydroxy  . Hemoglobin A1c  . Ambulatory referral to ENT     Referral Orders     Ambulatory referral to ENT  Kathe Becton, MSN, ANE, FNP-BC [facility] [address] [place], [place] [address] [phone]- fax  Problem List Items Addressed This Visit   None   Visit Diagnoses    Encounter to establish care    -  Primary   Annual physical exam       Bilateral hearing loss, unspecified hearing loss type  Relevant Orders   Ambulatory referral to ENT   Anxiety       Relevant Medications   FLUoxetine (PROZAC) 40 MG capsule   busPIRone (BUSPAR) 10 MG tablet   Depression, unspecified depression type       Relevant Medications   FLUoxetine (PROZAC) 40 MG capsule   busPIRone (BUSPAR) 10 MG tablet   Healthcare maintenance       Relevant Orders   CBC with  Differential   Comprehensive metabolic panel   TSH   Vitamin B12   Vitamin D, 25-hydroxy   Hemoglobin A1c   Follow up       Ear problem, bilateral       Relevant Orders   Ambulatory referral to ENT      Meds ordered this encounter  Medications  . FLUoxetine (PROZAC) 40 MG capsule    Sig: Take 1 capsule (40 mg total) by mouth daily.    Dispense:  30 capsule    Refill:  6  . busPIRone (BUSPAR) 10 MG tablet    Sig: Take 1 tablet (10 mg total) by mouth 3 (three) times daily.    Dispense:  60 tablet    Refill:  6    Follow-up: No follow-ups on file.    Azzie Glatter, FNP

## 2020-08-08 ENCOUNTER — Encounter: Payer: Self-pay | Admitting: Family Medicine

## 2020-08-08 LAB — CBC WITH DIFFERENTIAL/PLATELET
Basophils Absolute: 0 10*3/uL (ref 0.0–0.2)
Basos: 0 %
EOS (ABSOLUTE): 0.1 10*3/uL (ref 0.0–0.4)
Eos: 2 %
Hematocrit: 37.2 % (ref 34.0–46.6)
Hemoglobin: 12.3 g/dL (ref 11.1–15.9)
Immature Grans (Abs): 0 10*3/uL (ref 0.0–0.1)
Immature Granulocytes: 0 %
Lymphocytes Absolute: 1.6 10*3/uL (ref 0.7–3.1)
Lymphs: 28 %
MCH: 25.8 pg — ABNORMAL LOW (ref 26.6–33.0)
MCHC: 33.1 g/dL (ref 31.5–35.7)
MCV: 78 fL — ABNORMAL LOW (ref 79–97)
Monocytes Absolute: 0.5 10*3/uL (ref 0.1–0.9)
Monocytes: 9 %
Neutrophils Absolute: 3.4 10*3/uL (ref 1.4–7.0)
Neutrophils: 61 %
Platelets: 172 10*3/uL (ref 150–450)
RBC: 4.77 x10E6/uL (ref 3.77–5.28)
RDW: 14.4 % (ref 11.7–15.4)
WBC: 5.6 10*3/uL (ref 3.4–10.8)

## 2020-08-08 LAB — VITAMIN D 25 HYDROXY (VIT D DEFICIENCY, FRACTURES): Vit D, 25-Hydroxy: 16.9 ng/mL — ABNORMAL LOW (ref 30.0–100.0)

## 2020-08-08 LAB — TSH: TSH: 1.17 u[IU]/mL (ref 0.450–4.500)

## 2020-08-08 LAB — HEMOGLOBIN A1C
Est. average glucose Bld gHb Est-mCnc: 111 mg/dL
Hgb A1c MFr Bld: 5.5 % (ref 4.8–5.6)

## 2020-08-08 LAB — VITAMIN B12: Vitamin B-12: 250 pg/mL (ref 232–1245)

## 2020-08-09 ENCOUNTER — Encounter: Payer: Self-pay | Admitting: Family Medicine

## 2020-08-09 ENCOUNTER — Other Ambulatory Visit: Payer: Self-pay | Admitting: Family Medicine

## 2020-08-09 DIAGNOSIS — F419 Anxiety disorder, unspecified: Secondary | ICD-10-CM

## 2020-08-09 MED ORDER — BUSPIRONE HCL 10 MG PO TABS: 10.0000 mg | ORAL_TABLET | Freq: Two times a day (BID) | ORAL | 6 refills | Status: DC

## 2020-09-16 DIAGNOSIS — H9190 Unspecified hearing loss, unspecified ear: Secondary | ICD-10-CM | POA: Insufficient documentation

## 2021-02-07 ENCOUNTER — Ambulatory Visit: Payer: Commercial Managed Care - PPO | Admitting: Family Medicine

## 2021-02-07 ENCOUNTER — Ambulatory Visit: Payer: Commercial Managed Care - PPO | Admitting: Nurse Practitioner

## 2021-02-26 ENCOUNTER — Ambulatory Visit: Payer: Commercial Managed Care - PPO | Admitting: Nurse Practitioner

## 2021-03-12 ENCOUNTER — Ambulatory Visit (INDEPENDENT_AMBULATORY_CARE_PROVIDER_SITE_OTHER): Payer: Commercial Managed Care - PPO | Admitting: Nurse Practitioner

## 2021-03-12 ENCOUNTER — Other Ambulatory Visit: Payer: Self-pay

## 2021-03-12 ENCOUNTER — Encounter: Payer: Self-pay | Admitting: Nurse Practitioner

## 2021-03-12 VITALS — BP 141/85 | HR 94 | Temp 98.4°F | Ht 67.5 in | Wt 203.0 lb

## 2021-03-12 DIAGNOSIS — F32A Depression, unspecified: Secondary | ICD-10-CM | POA: Diagnosis not present

## 2021-03-12 DIAGNOSIS — Z1159 Encounter for screening for other viral diseases: Secondary | ICD-10-CM | POA: Diagnosis not present

## 2021-03-12 DIAGNOSIS — R21 Rash and other nonspecific skin eruption: Secondary | ICD-10-CM

## 2021-03-12 DIAGNOSIS — F419 Anxiety disorder, unspecified: Secondary | ICD-10-CM

## 2021-03-12 DIAGNOSIS — F320 Major depressive disorder, single episode, mild: Secondary | ICD-10-CM

## 2021-03-12 DIAGNOSIS — L2089 Other atopic dermatitis: Secondary | ICD-10-CM

## 2021-03-12 DIAGNOSIS — Z6791 Unspecified blood type, Rh negative: Secondary | ICD-10-CM | POA: Insufficient documentation

## 2021-03-12 MED ORDER — TRIAMCINOLONE ACETONIDE 0.5 % EX OINT: 1.0000 "application " | TOPICAL_OINTMENT | Freq: Two times a day (BID) | CUTANEOUS | 0 refills | Status: DC

## 2021-03-12 MED ORDER — FLUOXETINE HCL 20 MG PO TABS: ORAL_TABLET | ORAL | 0 refills | Status: DC

## 2021-03-12 NOTE — Patient Instructions (Addendum)
Major Depressive Disorder, Adult Major depressive disorder is a mental health condition. This disorder affects feelings. It can also affect the body. Symptoms of this condition last most of the day, almost every day, for 2 weeks. This disorder can affect: Relationships. Daily activities, such as work and school. Activities that you normally like to do. What are the causes? The cause of this condition is not known. The disorder is likely caused by a mix of things, including: Your personality, such as being a shy person. Your behavior, or how you act toward others. Your thoughts and feelings. Too much alcohol or drugs. How you react to stress. Health and mental problems that you have had for a long time. Things that hurt you in the past (trauma). Big changes in your life, such as divorce. What increases the risk? The following factors may make you more likely to develop this condition: Having family members with depression. Being a woman. Problems in the family. Low levels of some brain chemicals. Things that caused you pain as a child, especially if you lost a parent or were abused. A lot of stress in your life, such as from: Living without basic needs of life, such as food and shelter. Being treated poorly because of race, sex, or religion (discrimination). Health and mental problems that you have had for a long time. What are the signs or symptoms? The main symptoms of this condition are: Being sad all the time. Being grouchy all the time. Loss of interest in things and activities. Other symptoms include: Sleeping too much or too little. Eating too much or too little. Gaining or losing weight, without knowing why. Feeling tired or having low energy. Being restless and weak. Feeling hopeless, worthless, or guilty. Trouble thinking clearly or making decisions. Thoughts of hurting yourself or others, or thoughts of ending your life. Spending a lot of time alone. Inability to  complete common tasks of daily life. If you have very bad MDD, you may: Believe things that are not true. Hear, see, taste, or feel things that are not there. Have mild depression that lasts for at least 2 years. Feel very sad and hopeless. Have trouble speaking or moving. How is this treated? This condition may be treated with: Talk therapy. This teaches you to know bad thoughts, feelings, and actions and how to change them. This can also help you to communicate with others. This can be done with members of your family. Medicines. These can be used to treat worry (anxiety), depression, or low levels of chemicals in the brain. Lifestyle changes. You may need to: Limit alcohol use. Limit drug use. Get regular exercise. Get plenty of sleep. Make healthy eating choices. Spend more time outdoors. Brain stimulation. This treatment excites the brain. This is done when symptoms are very bad or have not gotten better with other treatments. Follow these instructions at home: Activity Get regular exercise as told. Spend time outdoors as told. Make time to do the things you enjoy. Find ways to deal with stress. Try to: Meditate. Do deep breathing. Spend time in nature. Keep a journal. Return to your normal activities as told by your doctor. Ask your doctor what activities are safe for you. Alcohol and drug use If you drink alcohol: Limit how much you use to: 0-1 drink a day for women. 0-2 drinks a day for men. Be aware of how much alcohol is in your drink. In the [place] one drink equals one 12 oz bottle of beer (355 mL),   one 5 oz glass of wine (148 mL), or one 1 oz glass of hard liquor (44 mL). Talk to your doctor about: Alcohol use. Alcohol can affect some medicines. Any drug use. General instructions  Take over-the-counter and prescription medicines and herbal preparations only as told by your doctor. Eat a healthy diet. Get a lot of sleep. Think about joining a support group.  Your doctor may be able to suggest one. Keep all follow-up visits as told by your doctor. This is important. Where to find more information: The [employer] on Mental Illness: www.nami.org [place] [employer] of Mental Health: http://www.maynard.net/ American Psychiatric Association: www.psychiatry.org/patients-families/ Contact a doctor if: Your symptoms get worse. You get new symptoms. Get help right away if: You hurt yourself. You have serious thoughts about hurting yourself or others. You see, hear, taste, smell, or feel things that are not there. If you ever feel like you may hurt yourself or others, or have thoughts about taking your own life, get help right away. Go to your nearest emergency department or: Call your local emergency services (911 in the [place] Call a suicide crisis helpline, such as the National Suicide Prevention Lifeline at 803-852-8336. This is open 24 hours a day in the [place] Text the Crisis Text Line at 315-079-3429 (in the [place] Summary Major depressive disorder is a mental health condition. This disorder affects feelings. Symptoms of this condition last most of the day, almost every day, for 2 weeks. The symptoms of this disorder can cause problems with relationships and with daily activities. There are treatments and support for people who get this disorder. You may need more than one type of treatment. Get help right away if you have serious thoughts about hurting yourself or others. This information is not intended to replace advice given to you by your health care provider. Make sure you discuss any questions you have with your health care provider. Document Revised: 05/06/2019 Document Reviewed: 05/06/2019 Elsevier Patient Education  2022 Elsevier Inc.  Managing Anxiety, Adult After being diagnosed with an anxiety disorder, you may be relieved to know why you have felt or behaved a certain way. You may also feel overwhelmed about the treatment ahead and what  it will mean for your life. With care and support, you can manage this condition and recover from it. How to manage lifestyle changes Managing stress and anxiety Stress is your body's reaction to life changes and events, both good and bad. Most stress will last just a few hours, but stress can be ongoing and can lead to more than just stress. Although stress can play a major role in anxiety, it is not the same as anxiety. Stress is usually caused by something external, such as a deadline, test, or competition. Stress normally passes after the triggering event has ended.  Anxiety is caused by something internal, such as imagining a terrible outcome or worrying that something will go wrong that will devastate you. Anxiety often does not go away even after the triggering event is over, and it can become long-term (chronic) worry. It is important to understand the differences between stress and anxiety and to manage your stress effectively so that it does not lead to an anxious response. Talk with your health care provider or a counselor to learn more about reducing anxiety and stress. He or she may suggest tension reduction techniques, such as: Music therapy. This can include creating or listening to music that you enjoy and that inspires you. Mindfulness-based meditation. This involves being  aware of your normal breaths while not trying to control your breathing. It can be done while sitting or walking. Centering prayer. This involves focusing on a word, phrase, or sacred image that means something to you and brings you peace. Deep breathing. To do this, expand your stomach and inhale slowly through your nose. Hold your breath for 3-5 seconds. Then exhale slowly, letting your stomach muscles relax. Self-talk. This involves identifying thought patterns that lead to anxiety reactions and changing those patterns. Muscle relaxation. This involves tensing muscles and then relaxing them. Choose a tension  reduction technique that suits your lifestyle and personality. These techniques take time and practice. Set aside 5-15 minutes a day to do them. Therapists can offer counseling and training in these techniques. The training to help with anxiety may be covered by some insurance plans. Other things you can do to manage stress and anxiety include: Keeping a stress/anxiety diary. This can help you learn what triggers your reaction and then learn ways to manage your response. Thinking about how you react to certain situations. You may not be able to control everything, but you can control your response. Making time for activities that help you relax and not feeling guilty about spending your time in this way. Visual imagery and yoga can help you stay calm and relax.  Medicines Medicines can help ease symptoms. Medicines for anxiety include: Anti-anxiety drugs. Antidepressants. Medicines are often used as a primary treatment for anxiety disorder. Medicines will be prescribed by a health care provider. When used together, medicines, psychotherapy, and tension reduction techniques may be the most effective treatment. Relationships Relationships can play a big part in helping you recover. Try to spend more time connecting with trusted friends and family members. Consider going to couples counseling, taking family education classes, or going to family therapy. Therapy can help you and others better understand your condition. How to recognize changes in your anxiety Everyone responds differently to treatment for anxiety. Recovery from anxiety happens when symptoms decrease and stop interfering with your daily activities at home or work. This may mean that you will start to: Have better concentration and focus. Worry will interfere less in your daily thinking. Sleep better. Be less irritable. Have more energy. Have improved memory. It is important to recognize when your condition is getting worse. Contact  your health care provider if your symptoms interfere with home or work and you feel like your condition is not improving. Follow these instructions at home: Activity Exercise. Most adults should do the following: Exercise for at least 150 minutes each week. The exercise should increase your heart rate and make you sweat (moderate-intensity exercise). Strengthening exercises at least twice a week. Get the right amount and quality of sleep. Most adults need 7-9 hours of sleep each night. Lifestyle  Eat a healthy diet that includes plenty of vegetables, fruits, whole grains, low-fat dairy products, and lean protein. Do not eat a lot of foods that are high in solid fats, added sugars, or salt. Make choices that simplify your life. Do not use any products that contain nicotine or tobacco, such as cigarettes, e-cigarettes, and chewing tobacco. If you need help quitting, ask your health care provider. Avoid caffeine, alcohol, and certain over-the-counter cold medicines. These may make you feel worse. Ask your pharmacist which medicines to avoid. General instructions Take over-the-counter and prescription medicines only as told by your health care provider. Keep all follow-up visits as told by your health care provider. This is important. Where  to find support You can get help and support from these sources: Self-help groups. Online and [employer]. A trusted spiritual leader. Couples counseling. Family education classes. Family therapy. Where to find more information You may find that joining a support group helps you deal with your anxiety. The following sources can help you locate counselors or support groups near you: Mental Health America: www.mentalhealthamerica.net Anxiety and Depression Association of [place] (ADAA): ProgramCam.de The [employer] on Mental Illness (NAMI): www.nami.org Contact a health care provider if you: Have a hard time staying focused or finishing daily  tasks. Spend many hours a day feeling worried about everyday life. Become exhausted by worry. Start to have headaches, feel tense, or have nausea. Urinate more than normal. Have diarrhea. Get help right away if you have: A racing heart and shortness of breath. Thoughts of hurting yourself or others. If you ever feel like you may hurt yourself or others, or have thoughts about taking your own life, get help right away. You can go to your nearest emergency department or call: Your local emergency services (911 in the [place] A suicide crisis helpline, such as the National Suicide Prevention Lifeline at 203-876-4964. This is open 24 hours a day. Summary Taking steps to learn and use tension reduction techniques can help calm you and help prevent triggering an anxiety reaction. When used together, medicines, psychotherapy, and tension reduction techniques may be the most effective treatment. Family, friends, and partners can play a big part in helping you recover from an anxiety disorder. This information is not intended to replace advice given to you by your health care provider. Make sure you discuss any questions you have with your health care provider. Document Revised: 10/25/2018 Document Reviewed: 10/25/2018 Elsevier Patient Education  2022 [employer].
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
## 2021-03-13 LAB — COMP. METABOLIC PANEL (12)
AST: 11 IU/L (ref 0–40)
Albumin/Globulin Ratio: 1.4 (ref 1.2–2.2)
Albumin: 4.2 g/dL (ref 3.9–5.0)
Alkaline Phosphatase: 93 IU/L (ref 44–121)
BUN/Creatinine Ratio: 9 (ref 9–23)
BUN: 6 mg/dL (ref 6–20)
Bilirubin Total: 0.2 mg/dL (ref 0.0–1.2)
Calcium: 9.1 mg/dL (ref 8.7–10.2)
Chloride: 104 mmol/L (ref 96–106)
Creatinine, Ser: 0.67 mg/dL (ref 0.57–1.00)
Globulin, Total: 3 g/dL (ref 1.5–4.5)
Glucose: 101 mg/dL — ABNORMAL HIGH (ref 70–99)
Potassium: 4.2 mmol/L (ref 3.5–5.2)
Sodium: 141 mmol/L (ref 134–144)
Total Protein: 7.2 g/dL (ref 6.0–8.5)
eGFR: 121 mL/min/{1.73_m2} (ref >59)

## 2021-03-13 LAB — HEPATITIS C ANTIBODY: Hep C Virus Ab: <0.1 s/co ratio (ref 0.0–0.9)

## 2021-03-14 ENCOUNTER — Encounter: Payer: Self-pay | Admitting: Nurse Practitioner

## 2021-03-14 DIAGNOSIS — F320 Major depressive disorder, single episode, mild: Secondary | ICD-10-CM | POA: Insufficient documentation

## 2021-04-11 ENCOUNTER — Telehealth (INDEPENDENT_AMBULATORY_CARE_PROVIDER_SITE_OTHER): Payer: Commercial Managed Care - PPO | Admitting: Nurse Practitioner

## 2021-04-11 DIAGNOSIS — Z91199 Patient's noncompliance with other medical treatment and regimen due to unspecified reason: Secondary | ICD-10-CM

## 2021-04-11 NOTE — Progress Notes (Signed)
   [facility] [address] [place], [place]  [address] Phone:  [phone]   Fax:  [phone] No show 

## 2021-05-12 ENCOUNTER — Encounter: Payer: Self-pay | Admitting: Nurse Practitioner

## 2021-05-12 ENCOUNTER — Other Ambulatory Visit: Payer: Self-pay

## 2021-05-12 ENCOUNTER — Ambulatory Visit (INDEPENDENT_AMBULATORY_CARE_PROVIDER_SITE_OTHER): Payer: Commercial Managed Care - PPO | Admitting: Nurse Practitioner

## 2021-05-12 VITALS — BP 131/85 | HR 86 | Temp 98.4°F | Ht 67.5 in | Wt 211.1 lb

## 2021-05-12 DIAGNOSIS — F419 Anxiety disorder, unspecified: Secondary | ICD-10-CM | POA: Diagnosis not present

## 2021-05-12 DIAGNOSIS — F22 Delusional disorders: Secondary | ICD-10-CM

## 2021-05-12 DIAGNOSIS — F331 Major depressive disorder, recurrent, moderate: Secondary | ICD-10-CM

## 2021-05-12 DIAGNOSIS — F32A Depression, unspecified: Secondary | ICD-10-CM

## 2021-05-12 MED ORDER — QUETIAPINE FUMARATE 25 MG PO TABS: 25.0000 mg | ORAL_TABLET | Freq: Every day | ORAL | 11 refills | Status: DC

## 2021-05-12 NOTE — Progress Notes (Signed)
[place], [place]  [address] Phone:  [phone]   Fax:  [phone]    Established Patient Office Visit  Subjective:  Patient ID: Debbie Robbins, female    DOB: 1990-12-01  Age: 30 y.o. MRN: [id number]  CC:  Chief Complaint  Patient presents with   Follow-up    Pt is here FU visit. Pt stated she had COVID but she still has a sore throat and cough and she has SOB when she is moving around pt has been taking OFC cold meds    HPI Debbie Robbins presents for follow up. She  has a past medical history of Anxiety, Coronavirus infection (02/2020), Depression, Obesity, Partial hearing loss, bilateral, Prediabetes, Vaginal Pap smear, abnormal, and Vitamin D deficiency (08/2020).    Sore Throat Patient complains of sore throat. Associated symptoms include . Onset of symptoms was 4 weeks ago, and have been gradually improving since that time. She is drinking plenty of fluids. She has not had recent close exposure to someone with proven streptococcal pharyngitis.  She is also concerned about her depression, anxiety and paranoid thoughts.  She reports that the paraniod thoughts, randomly.  She does have periods of mood swings.  She feels like this has gotten worse.  She was wanting to increase the fluoxetine to 60 mg daily however reports that the pharmacy never filled this request.  So she has been on fluoxetine 40 mg daily.  She reports that she is also using BuSpar 10 mg 3 times daily she admits that therapy is a family history and the grandmother of bipolar disorder. Past Medical History:  Diagnosis Date   Anxiety    Coronavirus infection 02/2020   Depression    Obesity    Partial hearing loss, bilateral    Prediabetes    Vaginal Pap smear, abnormal    Vitamin D deficiency 08/2020    Past Surgical History:  Procedure Laterality Date   WISDOM TOOTH EXTRACTION      Family History  Problem Relation Age of Onset   Anemia Mother     Cancer Maternal Grandmother        ovarian   Diabetes Maternal Grandmother    Diabetes Paternal Grandmother    Diabetes Paternal Grandfather    Heart disease Paternal Grandfather        heart attack    Social History   Socioeconomic History   Marital status: Single    Spouse name: Not on file   Number of children: Not on file   Years of education: Not on file   Highest education level: Not on file  Occupational History   Not on file  Tobacco Use   Smoking status: Former   Smokeless tobacco: Never  Vaping Use   Vaping Use: Every day  Substance and Sexual Activity   Alcohol use: Yes    Comment: socially   Drug use: No   Sexual activity: Yes    Birth control/protection: Condom, Pill  Other Topics Concern   Not on file  Social History Narrative   Not on file   Social Determinants of Health   Financial Resource Strain: Not on file  Food Insecurity: Not on file  Transportation Needs: Not on file  Physical Activity: Not on file  Stress: Not on file  Social Connections: Not on file  Intimate Partner Violence: Not on file    Outpatient Medications Prior to Visit  Medication Sig Dispense Refill  FLUoxetine (PROZAC) 40 MG capsule Take 1 capsule (40 mg total) by mouth daily. 30 capsule 6   Levonorgestrel-Ethinyl Estradiol (AMETHIA) 0.15-0.03 &0.01 MG tablet Take 1 tablet by mouth daily.     MOUNJARO 2.5 MG/0.5ML Pen SMARTSIG:2.5 Milligram(s) SUB-Q Once a Week     triamcinolone ointment (KENALOG) 0.5 % Apply 1 application topically 2 (two) times daily. 30 g 0   Acetaminophen-Codeine 300-30 MG tablet acetaminophen 300 mg-codeine 30 mg tablet  TAKE 1 TABLET BY MOUTH EVERY 6 HOURS AS NEEDED FOR DISCOMFORT (Patient not taking: Reported on 05/12/2021)     busPIRone (BUSPAR) 10 MG tablet Take 1 tablet (10 mg total) by mouth 2 (two) times daily. (Patient not taking: Reported on 05/12/2021) 60 tablet 6   FLUoxetine (PROZAC) 20 MG tablet Take 0.5 tablets (10 mg total) by mouth  daily for 7 days, THEN 0.5 tablets (10 mg total) daily. (Patient not taking: Reported on 05/12/2021) 90 tablet 0   No facility-administered medications prior to visit.    Allergies  Allergen Reactions   Other     Other reaction(s): Other (See Comments)   Strawberry (Diagnostic)     ROS Review of Systems  Neurological:  Positive for dizziness (occasional).     Objective:    Physical Exam Constitutional:      Appearance: She is obese.  HENT:     Head: Normocephalic and atraumatic.     Nose: Nose normal.  Cardiovascular:     Rate and Rhythm: Normal rate and regular rhythm.     Pulses: Normal pulses.     Heart sounds: Normal heart sounds.  Pulmonary:     Effort: Pulmonary effort is normal.     Breath sounds: Normal breath sounds.  Abdominal:     Palpations: Abdomen is soft.  Musculoskeletal:        General: Normal range of motion.     Cervical back: Normal range of motion.     Right lower leg: No edema.     Left lower leg: No edema.  Skin:    General: Skin is warm.     Capillary Refill: Capillary refill takes less than 2 seconds.  Neurological:     General: No focal deficit present.     Mental Status: She is alert and oriented to person, place, and time.  Psychiatric:        Mood and Affect: Mood normal.        Behavior: Behavior normal.        Thought Content: Thought content normal.    BP 131/85   Pulse 86   Temp 98.4 F (36.9 C)   Ht 5' 7.5" (1.715 m)   Wt 211 lb 2 oz (95.8 kg)   SpO2 100%   BMI 32.58 kg/m  Wt Readings from Last 3 Encounters:  05/12/21 211 lb 2 oz (95.8 kg)  03/12/21 203 lb (92.1 kg)  08/07/20 209 lb (94.8 kg)     Health Maintenance Due  Topic Date Due   PAP SMEAR-Modifier  11/06/2020     There are no preventive care reminders to display for this patient.  Lab Results  Component Value Date   TSH 1.170 08/07/2020   Lab Results  Component Value Date   WBC 5.6 08/07/2020   HGB 12.3 08/07/2020   HCT 37.2 08/07/2020   MCV  78 (L) 08/07/2020   PLT 172 08/07/2020   Lab Results  Component Value Date   NA 141 03/12/2021   K 4.2 03/12/2021  GLUCOSE 101 (H) 03/12/2021   BUN 6 03/12/2021   CREATININE 0.67 03/12/2021   BILITOT 0.2 03/12/2021   ALKPHOS 93 03/12/2021   AST 11 03/12/2021   PROT 7.2 03/12/2021   ALBUMIN 4.2 03/12/2021   CALCIUM 9.1 03/12/2021   EGFR 121 03/12/2021   No results found for: CHOL No results found for: HDL No results found for: LDLCALC No results found for: TRIG No results found for: CHOLHDL Lab Results  Component Value Date   HGBA1C 5.5 08/07/2020      Assessment & Plan:   Problem List Items Addressed This Visit       Other   Current mild episode of major depressive disorder without prior episode ([place]) - Primary Persistent we will continue fluoxetine 40 mg   Other Visit Diagnoses     Anxiety and depression       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    Dispense:  30 tablet    Refill:  11    Order Specific Question:   Supervising Provider    Answer:   Tresa Garter [[id number]]    Follow-up: Return in about 5 weeks (around 06/16/2021) for Follow-up for depression [address].    Vevelyn Francois, NP

## 2021-05-12 NOTE — Patient Instructions (Signed)
Mixed Bipolar Disorder Mixed bipolar disorder is a mental health disorder in which a person has episodes of emotional highs (mania), lows (depression), or both of these feelings at the same time. People with this disorder have very big mood changes (mood swings) that happen quickly on a regular basis. These episodes may be severe enough to cause problems with relationships, school, or work. In some cases, they can cause the person to be unsafe, and the person may need to stay in a hospital. What are the causes? The cause of this condition is not known. What increases the risk? The following factors may make you more likely to develop this condition: Having a family history of the disorder. Misusing substances such as alcohol or drugs. Having an anxiety disorder. Having another illness, such as heart disease or thyroid disease. What are the signs or symptoms? Symptoms of this condition include having episodes of mania or depression. Sometimes, symptoms of both happen at the same time. For instance, you may feel sad and full of energy at the same time. You may have mood swings almost every day. Symptoms of mania may include: Very high self-esteem or self-confidence. Being unusually talkative, or feeling a need to keep talking. Speech may be very fast. It may seem like you cannot stop talking. Racing thoughts or constant talking, with quick shifts between topics that may or may not be related (flight of ideas). Being less able or more able to focus. Increased purposeful activity, such as work, study, or social activity. Increased nonproductive activity. This could be pacing, squirming and fidgeting, or finger and toe tapping. Impulsive behavior and poor judgment. These may lead to high-risk activities, such as having unprotected sex or spending a lot of money. Symptoms of depression may include: Feeling sad, hopeless, or helpless. Lack of feeling or caring about anything. Not being able to enjoy  things that you used to enjoy. Trouble concentrating or remembering. Trouble making decisions. Thoughts of death, or wanting to harm yourself. How is this diagnosed? This condition may be diagnosed based on: Your symptoms, your medical history, and a mental health (psychiatric) assessment. Your health care provider will ask about your emotional episodes. A physical exam. This is done to rule out any health problems that may be causing symptoms. Your health care provider will also ask about your alcohol and drug use. How is this treated? Bipolar disorder is a long-term (chronic) illness. It is best controlled with treatment that is given on an ongoing basis, rather than only when symptoms are present. A combination of treatments is best. Treatment may include: Medicines. These can be prescribed by a provider who specializes in treating mental health disorders (psychiatrist). Medicines called mood stabilizers, antipsychotics, or antidepressants may be prescribed. If symptoms occur while one type of medicine is taken, other medicines may be added. Talk therapy (psychotherapy). Some forms of talk therapy, such as cognitive behavioral therapy (CBT), can provide support, education, and guidance. Methods of managing your condition, such as journaling or relaxation exercises. These may include: Yoga. Meditation. Deep breathing. Lifestyle changes, such as: Avoiding alcohol and drug use. Exercising regularly. Getting plenty of sleep. Making healthy eating choices. In severe cases, if other treatments do not work, a procedure called electroconvulsive therapy (ECT) may be used. In ECT, short electrical pulses are sent to the brain through the scalp. This is done to change the brain chemicals that send messages between brain cells (neurotransmitters). Follow these instructions at home: Activity Return to your normal activities as told  by your health care provider. Find activities that you enjoy, and  make time to do them. Get regular exercise. Lifestyle  Follow a set schedule for eating and sleeping. Eat a balanced diet that includes fresh fruits and vegetables, whole grains, low-fat dairy products, and lean meat. Get 7-8 or more hours of sleep each night. Avoid using products that contain nicotine or tobacco. If you need help quitting, ask your health care provider. Do not drink alcohol or use drugs. General instructions Take over-the-counter and prescription medicines only as told by your health care provider. Think about joining a support group. Your health care provider may be able to recommend one. Talk with your family and loved ones about your treatment goals and about how they can help. Keep all follow-up visits. This is important. Where to find more information [employer] on Mental Illness: nami[employer] of Mental Health: BloggerCourse.com Contact a health care provider if: Your symptoms get worse. You have side effects from your medicine. You have trouble sleeping. You have trouble doing daily activities. You feel unsafe in your surroundings. You are misusing substances. Get help right away if: You think about hurting yourself or you try to hurt yourself. You think about suicide. If you ever feel like you may hurt yourself or others, or have thoughts about taking your own life, get help right away. Go to your nearest emergency department or: Call your local emergency services (911 in the [place] Call a suicide crisis helpline, such as the National Suicide Prevention Lifeline at 514-775-6389 or 988 in the [place] This is open 24 hours a day. Text the Crisis Text Line at 639-208-8842 (in the [place] Summary Mixed bipolar disorder is a mental health disorder in which a person has episodes of emotional highs (mania), lows (depression), or both of these feelings at the same time. Bipolar disorder is a long-term illness. It is best controlled with treatment that is  given on an ongoing basis, rather than only when symptoms are present. The best treatment approach is a combination of medicine, talk therapy, and methods of managing the condition. This information is not intended to replace advice given to you by your health care provider. Make sure you discuss any questions you have with your health care provider. Document Revised: 12/19/2020 Document Reviewed: 11/14/2020 Elsevier Patient Education  2022 [employer].

## 2021-06-16 ENCOUNTER — Ambulatory Visit: Payer: Commercial Managed Care - PPO | Admitting: Nurse Practitioner

## 2021-08-27 ENCOUNTER — Telehealth: Payer: Self-pay | Admitting: Nurse Practitioner

## 2021-08-27 NOTE — Telephone Encounter (Signed)
Pt wondering if she can get a 1 month prescription of Fluoxetine until her appointment here April 3.  ?

## 2021-08-28 ENCOUNTER — Other Ambulatory Visit: Payer: Self-pay | Admitting: Nurse Practitioner

## 2021-08-28 DIAGNOSIS — F32A Depression, unspecified: Secondary | ICD-10-CM

## 2021-08-28 MED ORDER — FLUOXETINE HCL 40 MG PO CAPS: 40.0000 mg | ORAL_CAPSULE | Freq: Every day | ORAL | 0 refills | Status: DC

## 2021-08-28 NOTE — Telephone Encounter (Signed)
The refill has been sent. Thanks  ° °

## 2021-09-08 ENCOUNTER — Ambulatory Visit: Payer: Commercial Managed Care - PPO | Admitting: Nurse Practitioner

## 2021-09-08 VITALS — BP 125/75 | HR 74 | Temp 98.8°F | Ht 67.5 in | Wt 226.2 lb

## 2021-09-08 DIAGNOSIS — Z1283 Encounter for screening for malignant neoplasm of skin: Secondary | ICD-10-CM

## 2021-09-08 DIAGNOSIS — F32A Depression, unspecified: Secondary | ICD-10-CM | POA: Diagnosis not present

## 2021-09-08 DIAGNOSIS — F419 Anxiety disorder, unspecified: Secondary | ICD-10-CM | POA: Diagnosis not present

## 2021-09-08 MED ORDER — FLUOXETINE HCL 40 MG PO CAPS: 40.0000 mg | ORAL_CAPSULE | Freq: Every day | ORAL | 2 refills | Status: DC

## 2021-09-08 NOTE — Patient Instructions (Addendum)
1. Depression, unspecified depression type ? ?- FLUoxetine (PROZAC) 40 MG capsule; Take 1 capsule (40 mg total) by mouth daily.  Dispense: 30 capsule; Refill: 2 ? ?- Ambulatory referral to Psychiatry ? ?2. Anxiety ? ?- Ambulatory referral to Psychiatry ? ? ?3. Skin cancer screening ? ?- Ambulatory referral to Dermatology ? ? ? ? ?Follow up: ? ?Follow up in 3 months ? ? ?

## 2021-09-08 NOTE — Progress Notes (Signed)
@Patient  ID: , female    DOB: [birth year], 31 y.o.   MRN: [id number] ? ?Chief Complaint  ?Patient presents with  ? Follow-up  ?  Pt stated she need medication for her anxiety also requesting refill on fluoxetine. Pt stated she would like to take bipolar off her medical record as a diagnosis  ? ? ?Referring provider: ?[id number], NP ? ?HPI ? ?Patient presents today for medication refill.  She was recently started on Prozac and states that this is working well for her.  She states that she may need an a an increase in the dosage to better help control her anxiety.  She is requesting referral to psychiatry.  Patient is currently seeing a therapist for anxiety.  Patient is also requesting a referral to dermatology for routine skin cancer screening.  Denies f/c/s, n/v/d, hemoptysis, PND, chest pain or edema. ? ? ? ? ? ? ?Allergies  ?Allergen Reactions  ? Other   ?  Other reaction(s): Other (See Comments)  ? Strawberry (Diagnostic)   ? ? ?Immunization History  ?Administered Date(s) Administered  ? Influenza-Unspecified 04/26/2014  ? Td 06/08/2005  ? Tdap 05/25/2019  ? ? ?Past Medical History:  ?Diagnosis Date  ? Anxiety   ? Coronavirus infection 02/2020  ? Depression   ? Obesity   ? Partial hearing loss, bilateral   ? Prediabetes   ? Vaginal Pap smear, abnormal   ? Vitamin D deficiency 08/2020  ? ? ?Tobacco History: ?Social History  ? ?Tobacco Use  ?Smoking Status Former  ?Smokeless Tobacco Never  ? ?Counseling given: Not Answered ? ? ?Outpatient Encounter Medications as of 09/08/2021  ?Medication Sig  ? MOUNJARO 2.5 MG/0.5ML Pen SMARTSIG:2.5 Milligram(s) SUB-Q Once a Week  ? triamcinolone ointment (KENALOG) 0.5 % Apply 1 application topically 2 (two) times daily.  ? [DISCONTINUED] FLUoxetine (PROZAC) 40 MG capsule Take 1 capsule (40 mg total) by mouth daily.  ? Acetaminophen-Codeine 300-30 MG tablet acetaminophen 300 mg-codeine 30 mg tablet ? TAKE 1 TABLET BY MOUTH EVERY 6 HOURS AS NEEDED FOR DISCOMFORT  (Patient not taking: Reported on 05/12/2021)  ? busPIRone (BUSPAR) 10 MG tablet Take 1 tablet (10 mg total) by mouth 2 (two) times daily. (Patient not taking: Reported on 05/12/2021)  ? FLUoxetine (PROZAC) 40 MG capsule Take 1 capsule (40 mg total) by mouth daily.  ? Levonorgestrel-Ethinyl Estradiol (AMETHIA) 0.15-0.03 &0.01 MG tablet Take 1 tablet by mouth daily.  ? QUEtiapine (SEROQUEL) 25 MG tablet Take 1 tablet (25 mg total) by mouth at bedtime. (Patient not taking: Reported on 09/08/2021)  ? ?No facility-administered encounter medications on file as of 09/08/2021.  ? ? ? ?Review of Systems ? ?Review of Systems  ?Constitutional: Negative.   ?HENT: Negative.    ?Cardiovascular: Negative.   ?Gastrointestinal: Negative.   ?Allergic/Immunologic: Negative.   ?Neurological: Negative.   ?Psychiatric/Behavioral:  The patient is nervous/anxious.    ? ? ? ?Physical Exam ? ?BP 125/75 (BP Location: Right Arm, Patient Position: Sitting, Cuff Size: Large)   Pulse 74   Temp 98.8 ?F (37.1 ?C)   Ht 5' 7.5" (1.715 m)   Wt 226 lb 3.2 oz (102.6 kg)   SpO2 100%   BMI 34.91 kg/m?  ? ?Wt Readings from Last 5 Encounters:  ?09/08/21 226 lb 3.2 oz (102.6 kg)  ?05/12/21 211 lb 2 oz (95.8 kg)  ?03/12/21 203 lb (92.1 kg)  ?08/07/20 209 lb (94.8 kg)  ?08/09/19 216 lb 14.9 oz (98.4 kg)  ? ? ? ?  Physical Exam ?Vitals and nursing note reviewed.  ?Constitutional:   ?   General: She is not in acute distress. ?   Appearance: She is well-developed.  ?Cardiovascular:  ?   Rate and Rhythm: Normal rate and regular rhythm.  ?Pulmonary:  ?   Effort: Pulmonary effort is normal.  ?   Breath sounds: Normal breath sounds.  ?Neurological:  ?   Mental Status: She is alert and oriented to person, place, and time.  ? ? ? ?Lab Results: ? ?CBC ?   ?Component Value Date/Time  ? WBC 5.6 08/07/2020 1042  ? WBC 10.3 08/10/2019 0609  ? RBC 4.77 08/07/2020 1042  ? RBC 3.59 (L) 08/10/2019 4765  ? HGB 12.3 08/07/2020 1042  ? HCT 37.2 08/07/2020 1042  ? PLT 172 08/07/2020  1042  ? MCV 78 (L) 08/07/2020 1042  ? MCH 25.8 (L) 08/07/2020 1042  ? MCH 24.0 (L) 08/10/2019 4650  ? MCHC 33.1 08/07/2020 1042  ? MCHC 30.2 08/10/2019 0609  ? RDW 14.4 08/07/2020 1042  ? LYMPHSABS 1.6 08/07/2020 1042  ? EOSABS 0.1 08/07/2020 1042  ? BASOSABS 0.0 08/07/2020 1042  ? ? ?BMET ?   ?Component Value Date/Time  ? NA 141 03/12/2021 1613  ? K 4.2 03/12/2021 1613  ? CL 104 03/12/2021 1613  ? GLUCOSE 101 (H) 03/12/2021 1613  ? BUN 6 03/12/2021 1613  ? CREATININE 0.67 03/12/2021 1613  ? CALCIUM 9.1 03/12/2021 1613  ? ? ?BNP ?No results found for: BNP ? ?ProBNP ?No results found for: PROBNP ? ?Imaging: ?No results found. ? ? ?Assessment & Plan:  ? ?Depression ?- FLUoxetine (PROZAC) 40 MG capsule; Take 1 capsule (40 mg total) by mouth daily.  Dispense: 30 capsule; Refill: 2 ? ?- Ambulatory referral to Psychiatry ? ?2. Anxiety ? ?- Ambulatory referral to Psychiatry ? ? ?3. Skin cancer screening ? ?- Ambulatory referral to Dermatology ? ? ? ? ?Follow up: ? ?Follow up in 3 months ? ? ? ? ?Ivonne Andrew, NP ?09/10/2021 ? ?

## 2021-09-10 ENCOUNTER — Encounter: Payer: Self-pay | Admitting: Nurse Practitioner

## 2021-09-10 NOTE — Assessment & Plan Note (Signed)
-   FLUoxetine (PROZAC) 40 MG capsule; Take 1 capsule (40 mg total) by mouth daily.  Dispense: 30 capsule; Refill: 2 ? ?- Ambulatory referral to Psychiatry ? ?2. Anxiety ? ?- Ambulatory referral to Psychiatry ? ? ?3. Skin cancer screening ? ?- Ambulatory referral to Dermatology ? ? ? ? ?Follow up: ? ?Follow up in 3 months ?

## 2021-10-02 ENCOUNTER — Other Ambulatory Visit: Payer: Self-pay | Admitting: Nurse Practitioner

## 2021-10-02 DIAGNOSIS — L2089 Other atopic dermatitis: Secondary | ICD-10-CM

## 2021-10-02 DIAGNOSIS — R21 Rash and other nonspecific skin eruption: Secondary | ICD-10-CM

## 2021-12-23 ENCOUNTER — Telehealth (HOSPITAL_COMMUNITY): Payer: Self-pay | Admitting: Psychiatry

## 2022-05-29 ENCOUNTER — Ambulatory Visit: Payer: Self-pay | Admitting: Nurse Practitioner

## 2022-06-08 IMAGING — US US BREAST*R* LIMITED INC AXILLA
1 series · 2 of 2 positions shown · non-contrast
Comparison: None

CLINICAL DATA: Focal pain right breast

EXAM:
ULTRASOUND OF THE right BREAST

[Series 1: us breast*right* limited inc axilla · 0.07mm/px · 2 of 2 slices shown]
[im 1/2]
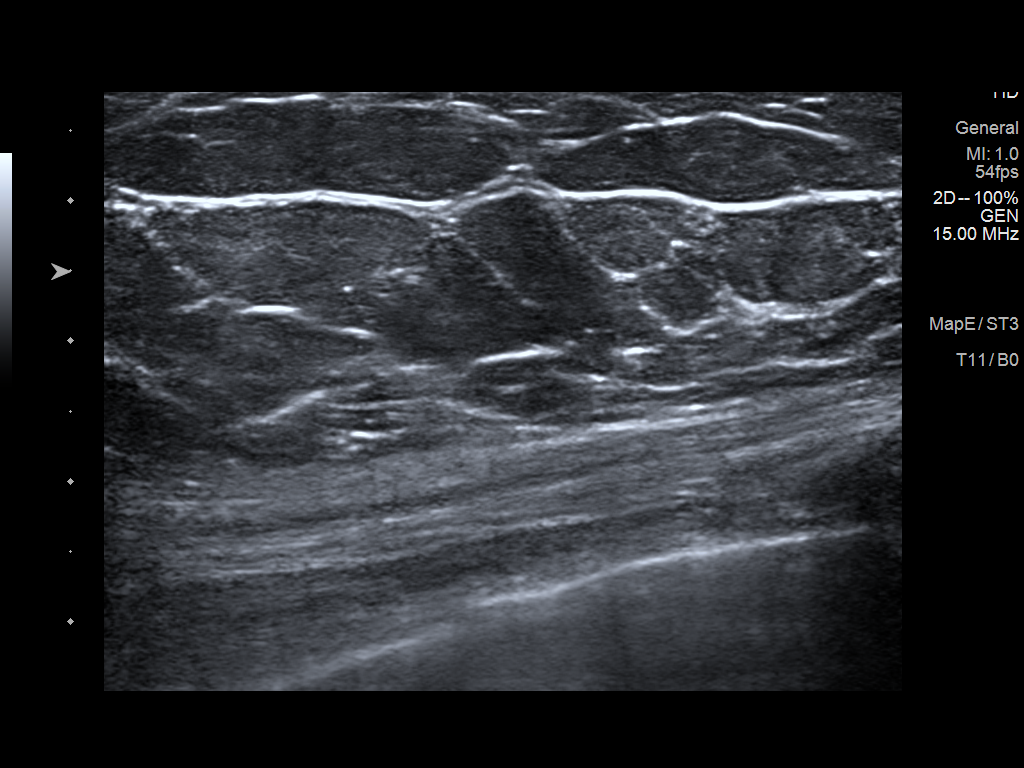
[im 2/2]
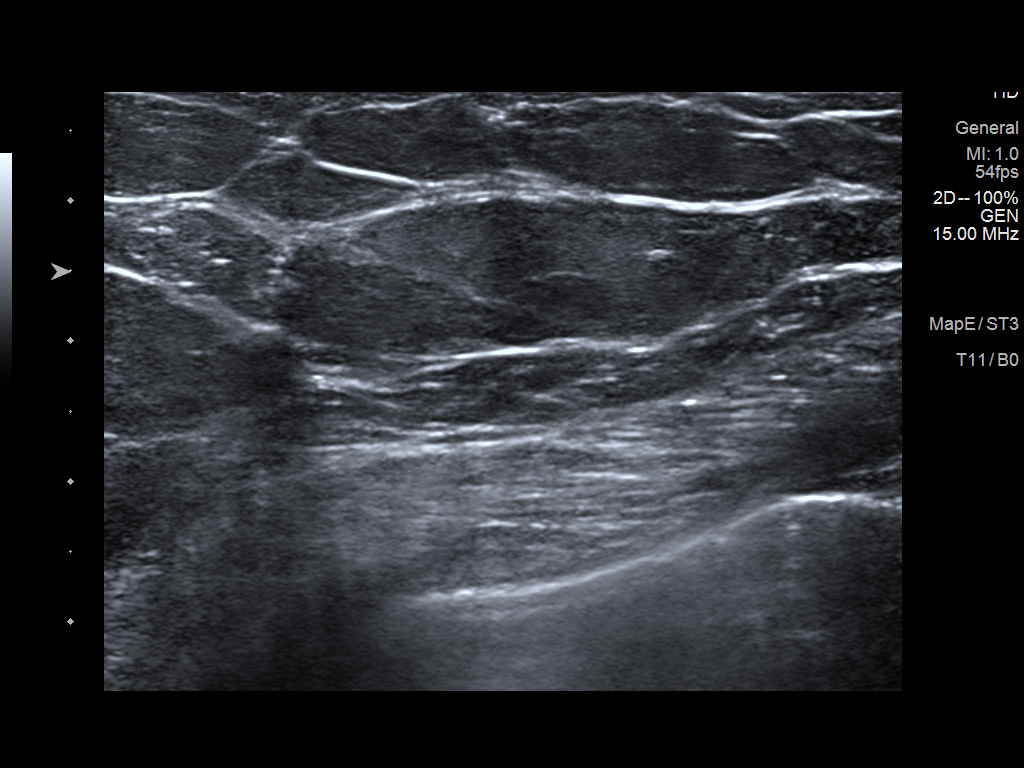

[2 of 2 positions shown; findings below may reference images not displayed]

FINDINGS: Targeted ultrasound is performed, showing no focal abnormal discrete
cystic or solid lesion in focal area pain right breast 2 o'clock 10
cm from nipple.
IMPRESSION: Negative.

RECOMMENDATION:
Routine screening mammogram at age 40.

I have discussed the findings and recommendations with the patient.
If applicable, a reminder letter will be sent to the patient
regarding the next appointment.

BI-RADS CATEGORY  1: Negative.

## 2022-06-11 ENCOUNTER — Encounter: Payer: Self-pay | Admitting: Nurse Practitioner

## 2022-06-11 ENCOUNTER — Ambulatory Visit: Payer: 59 | Admitting: Nurse Practitioner

## 2022-06-11 VITALS — BP 119/73 | HR 86 | Ht 67.5 in | Wt 217.0 lb

## 2022-06-11 DIAGNOSIS — F419 Anxiety disorder, unspecified: Secondary | ICD-10-CM | POA: Diagnosis not present

## 2022-06-11 DIAGNOSIS — M67431 Ganglion, right wrist: Secondary | ICD-10-CM | POA: Diagnosis not present

## 2022-06-11 DIAGNOSIS — F32A Depression, unspecified: Secondary | ICD-10-CM | POA: Diagnosis not present

## 2022-06-11 DIAGNOSIS — R7303 Prediabetes: Secondary | ICD-10-CM

## 2022-06-11 NOTE — Progress Notes (Signed)
@Patient  ID: Debbie Robbins, female    DOB: Dec 09, 1990, 32 y.o.   MRN: [id number]  Chief Complaint  Patient presents with   Hypertension    Pt stated--headache, twitching eye --and referral to psychiatry.    Referring provider: Vevelyn Francois, NP   HPI  Debbie Robbins presents for follow up. She  has a past medical history of Anxiety, Coronavirus infection (02/2020), Depression, Obesity, Partial hearing loss, bilateral, Prediabetes, Vaginal Pap smear, abnormal, and Vitamin D deficiency (08/2020).    Patient presents today to follow-up on blood pressure.  Patient has been having a lot of anxiety.  She has switched insurance and would like a new referral to psychiatry.  She states that she is having stress on her job.  Blood pressure was normal in office today.  We will check blood work today.  Patient states that she has been having eye twitching and which she thinks is related to anxiety as well.  Patient also is concerned about raised area redness.  We discussed that this is a ganglion cyst.  We discussed treatment options.  Patient states that she will this will resolve on its own.  Denies f/c/s, n/v/d, hemoptysis, PND, leg swelling Denies chest pain or edema      Allergies  Allergen Reactions   Other     Other reaction(s): Other (See Comments)   Strawberry (Diagnostic)     Immunization History  Administered Date(s) Administered   Influenza-Unspecified 04/26/2014   Td 06/08/2005   Tdap 05/25/2019    Past Medical History:  Diagnosis Date   Anxiety    Coronavirus infection 02/2020   Depression    Obesity    Partial hearing loss, bilateral    Prediabetes    Vaginal Pap smear, abnormal    Vitamin D deficiency 08/2020    Tobacco History: Social History   Tobacco Use  Smoking Status Former  Smokeless Tobacco Never   Counseling given: Not Answered   Outpatient Encounter Medications as of 06/11/2022  Medication Sig   Acetaminophen-Codeine 300-30 MG tablet     FLUoxetine (PROZAC) 40 MG capsule Take 1 capsule (40 mg total) by mouth daily.   lamoTRIgine (LAMICTAL) 25 MG tablet Take 50 mg by mouth daily.   Levonorgestrel-Ethinyl Estradiol (AMETHIA) 0.15-0.03 &0.01 MG tablet Take 1 tablet by mouth daily.   [DISCONTINUED] MOUNJARO 2.5 MG/0.5ML Pen SMARTSIG:2.5 Milligram(s) SUB-Q Once a Week   triamcinolone ointment (KENALOG) 0.5 % APPLY TO AFFECTED AREA TWICE A DAY (Patient not taking: Reported on 06/11/2022)   [DISCONTINUED] busPIRone (BUSPAR) 10 MG tablet Take 1 tablet (10 mg total) by mouth 2 (two) times daily. (Patient not taking: Reported on 05/12/2021)   [DISCONTINUED] QUEtiapine (SEROQUEL) 25 MG tablet Take 1 tablet (25 mg total) by mouth at bedtime. (Patient not taking: Reported on 09/08/2021)   No facility-administered encounter medications on file as of 06/11/2022.     Review of Systems  Review of Systems  Constitutional: Negative.   HENT: Negative.    Cardiovascular: Negative.   Gastrointestinal: Negative.   Allergic/Immunologic: Negative.   Neurological: Negative.   Psychiatric/Behavioral: Negative.         Physical Exam  BP 119/73   Pulse 86   Ht 5' 7.5" (1.715 m)   Wt 217 lb (98.4 kg)   SpO2 96%   BMI 33.49 kg/m   Wt Readings from Last 5 Encounters:  06/11/22 217 lb (98.4 kg)  09/08/21 226 lb 3.2 oz (102.6 kg)  05/12/21 211 lb 2 oz (  95.8 kg)  03/12/21 203 lb (92.1 kg)  08/07/20 209 lb (94.8 kg)     Physical Exam Vitals and nursing note reviewed.  Constitutional:      General: She is not in acute distress.    Appearance: She is well-developed.  Cardiovascular:     Rate and Rhythm: Normal rate and regular rhythm.  Pulmonary:     Effort: Pulmonary effort is normal.     Breath sounds: Normal breath sounds.  Neurological:     Mental Status: She is alert and oriented to person, place, and time.         Assessment & Plan:   Prediabetes - CBC - Comprehensive metabolic panel  2. Ganglion cyst of wrist,  right   Follow up:  Follow up in 6 months     Fenton Foy, NP 06/12/2022

## 2022-06-11 NOTE — Patient Instructions (Addendum)
1. Prediabetes  - CBC - Comprehensive metabolic panel  2. Ganglion cyst of wrist, right   Follow up:  Follow up in 6 months      Ganglion Cyst  A ganglion cyst is a non-cancerous, fluid-filled lump of tissue that occurs near a joint, tendon, or ligament. The cyst grows out of a joint or the lining of a tendon or ligament. Ganglion cysts most often develop in the hand or wrist, but they can also develop in the shoulder, elbow, hip, knee, ankle, or foot. Ganglion cysts are ball-shaped or egg-shaped. Their size can range from the size of a pea to larger than a grape. Increased activity may cause the cyst to get bigger because more fluid starts to build up. What are the causes? The exact cause of this condition is not known, but it may be related to: Inflammation or irritation around the joint. An injury or tear in the layers of tissue around the joint (joint capsule). Repetitive movements or overuse. History of acute or repeated injury. What increases the risk? You are more likely to develop this condition if: You are a female. You are 61-26 years old. What are the signs or symptoms? The main symptom of this condition is a lump. It most often appears on the hand or wrist. In many cases, there are no other symptoms, but a cyst can sometimes cause: Tingling. Pain or tenderness. Numbness. Weakness or loss of strength in the affected joint. Decreased range of motion in the affected area of the body. How is this diagnosed? Ganglion cysts are usually diagnosed based on a physical exam. Your health care provider will feel the lump and may shine a light next to it. If it is a ganglion cyst, the light will likely shine through it. Your health care provider may order an X-ray, ultrasound, MRI, or CT scan to rule out other conditions. How is this treated? Ganglion cysts often go away on their own without treatment. If you have pain or other symptoms, treatment may be needed. Treatment is  also needed if the ganglion cyst limits your movement or if it gets infected. Treatment may include: Wearing a brace or splint on your wrist or finger. Taking anti-inflammatory medicine. Having fluid drained from the lump with a needle (aspiration). Getting an injection of medicine into the joint to decrease inflammation. This may be corticosteroids, ethanol, or hyaluronidase. Having surgery to remove the ganglion cyst. Placing a pad in your shoe or wearing shoes that will not rub against the cyst if it is on your foot. Follow these instructions at home: Do not press on the ganglion cyst, poke it with a needle, or hit it. Take over-the-counter and prescription medicines only as told by your health care provider. If you have a brace or splint: Wear it as told by your health care provider. Remove it as told by your health care provider. Ask if you need to remove it when you take a shower or a bath. Watch your ganglion cyst for any changes. Keep all follow-up visits as told by your health care provider. This is important. Contact a health care provider if: Your ganglion cyst becomes larger or more painful. You have pus coming from the lump. You have weakness or numbness in the affected area. You have a fever or chills. Get help right away if: You have a fever and have any of these in the cyst area: Increased redness. Red streaks. Swelling. Summary A ganglion cyst is a non-cancerous, fluid-filled lump  that occurs near a joint, tendon, or ligament. Ganglion cysts most often develop in the hand or wrist, but they can also develop in the shoulder, elbow, hip, knee, ankle, or foot. Ganglion cysts often go away on their own without treatment. This information is not intended to replace advice given to you by your health care provider. Make sure you discuss any questions you have with your health care provider. Document Revised: 08/16/2019 Document Reviewed: 08/16/2019 Elsevier Patient Education   [place].

## 2022-06-12 ENCOUNTER — Encounter: Payer: Self-pay | Admitting: Nurse Practitioner

## 2022-06-12 LAB — COMPREHENSIVE METABOLIC PANEL
ALT: 17 IU/L (ref 0–32)
AST: 12 IU/L (ref 0–40)
Albumin/Globulin Ratio: 1.6 (ref 1.2–2.2)
Albumin: 4.2 g/dL (ref 3.9–4.9)
Alkaline Phosphatase: 93 IU/L (ref 44–121)
BUN/Creatinine Ratio: 10 (ref 9–23)
BUN: 8 mg/dL (ref 6–20)
Bilirubin Total: 0.3 mg/dL (ref 0.0–1.2)
CO2: 24 mmol/L (ref 20–29)
Calcium: 9 mg/dL (ref 8.7–10.2)
Chloride: 104 mmol/L (ref 96–106)
Creatinine, Ser: 0.77 mg/dL (ref 0.57–1.00)
Globulin, Total: 2.6 g/dL (ref 1.5–4.5)
Glucose: 94 mg/dL (ref 70–99)
Potassium: 4.1 mmol/L (ref 3.5–5.2)
Sodium: 139 mmol/L (ref 134–144)
Total Protein: 6.8 g/dL (ref 6.0–8.5)
eGFR: 106 mL/min/{1.73_m2} (ref >59)

## 2022-06-12 LAB — CBC
Hematocrit: 35.5 % (ref 34.0–46.6)
Hemoglobin: 11.6 g/dL (ref 11.1–15.9)
MCH: 27 pg (ref 26.6–33.0)
MCHC: 32.7 g/dL (ref 31.5–35.7)
MCV: 83 fL (ref 79–97)
Platelets: 196 10*3/uL (ref 150–450)
RBC: 4.3 x10E6/uL (ref 3.77–5.28)
RDW: 13.2 % (ref 11.7–15.4)
WBC: 7.4 10*3/uL (ref 3.4–10.8)

## 2022-06-12 NOTE — Assessment & Plan Note (Signed)
-   CBC - Comprehensive metabolic panel  2. Ganglion cyst of wrist, right   Follow up:  Follow up in 6 months

## 2022-08-20 ENCOUNTER — Encounter (HOSPITAL_COMMUNITY): Payer: Self-pay | Admitting: Psychiatry

## 2022-08-20 ENCOUNTER — Ambulatory Visit (HOSPITAL_BASED_OUTPATIENT_CLINIC_OR_DEPARTMENT_OTHER): Payer: Self-pay | Admitting: Psychiatry

## 2022-08-20 VITALS — Wt 217.0 lb

## 2022-08-20 DIAGNOSIS — F3162 Bipolar disorder, current episode mixed, moderate: Secondary | ICD-10-CM

## 2022-08-20 DIAGNOSIS — F411 Generalized anxiety disorder: Secondary | ICD-10-CM

## 2022-08-20 MED ORDER — LAMOTRIGINE 100 MG PO TABS: 100.0000 mg | ORAL_TABLET | Freq: Every day | ORAL | 0 refills | Status: DC

## 2022-08-20 MED ORDER — BUPROPION HCL ER (XL) 150 MG PO TB24: 150.0000 mg | ORAL_TABLET | ORAL | 0 refills | Status: DC

## 2022-08-20 NOTE — Progress Notes (Signed)
Psychiatric Initial Adult Assessment   Patient Identification: Debbie Robbins MRN:  [id number] Date of Evaluation:  08/20/2022  Referral Source: PCP  Chief Complaint:   Chief Complaint  Patient presents with   Establish Care   Anxiety   Visit Diagnosis:    ICD-10-CM   1. GAD (generalized anxiety disorder)  F41.1 lamoTRIgine (LAMICTAL) 100 MG tablet    buPROPion (WELLBUTRIN XL) 150 MG 24 hr tablet    2. Bipolar 1 disorder, mixed, moderate (HCC)  F31.62 lamoTRIgine (LAMICTAL) 100 MG tablet      History of Present Illness: Patient is 32 year old employed, married female who was referred from primary care physician for the management of her psychiatric symptoms.  Patient told she struggled with anxiety, depression since teenager but past 1 year symptoms started to get worse.  Her stressors are husband is a Administrator and most of the time he is out of the home, 39-year-old son who is handful and from Sunday to Thursday her best friend stays with her with 26 year old and 20-year-old.  Patient feels that she is mentally drained and a lot of anxiety and lack of motivation.  She feels easily tired and exhausted.  Patient told she was taking the medication from her psychiatrist Joellen Jersey based in [place], [place] but due to insurance change she is no longer see the [facility].  She was prescribed Lamictal and Prozac and recently her primary care doctor added low-dose Lexapro 5 mg.  She also briefly saw online doctor to get the refills.  She liked to have the same doctor on every visit and like to establish care in our office.  Patient told lately started having panic attacks, severe depression when she feels unmotivated to do things.  She struggled in the morning and does not sleep very well.  She has no difficulty going into sleep but reported frequent awakening.  She also reported some mood irritability, burst of energy which she described excessive cleaning and urged to buy excessively but do not have  money.  She reported after the burst of energy she feels withdrawn, isolated and may crash into depression.  She had read about bipolar and wondering if she had the symptoms.  She also reported having passive and fleeting thoughts when driving but never act on it.  She had a good support from her family who live close by, friend's family member and coworkers.  Patient works as a Water quality scientist and a [employer] and job sometimes challenging but manageable.  In the past she had tried BuSpar but it made her sleepy and groggy.  She also reported occasional drinking alcohol but when she drinks she drinks a lot and had blackout.  She started using THC Gummies once in a while.  She has no seizures, DUI.  She feels sometimes getting very snappy and quick to the children and friends noticed her behavior.  She denies any significant weight loss or weight gain.  She denies any psychosis, paranoia, nightmares or flashback.  She used to see a therapist but not lately due to her insurance and not able to connect with a therapist.  Associated Signs/Symptoms: Depression Symptoms:  depressed mood, anhedonia, insomnia, fatigue, hopelessness, anxiety, panic attacks, disturbed sleep, (Hypo) Manic Symptoms:  Elevated Mood, Impulsivity, Irritable Mood, Labiality of Mood, Anxiety Symptoms:  Excessive Worry, Panic Symptoms, Psychotic Symptoms:   none PTSD Symptoms: NA  Past Psychiatric History: No history of suicidal attempt, inpatient treatment.  Struggle with anxiety depression and mood swings since teenager.  [facility].  Prescribed Lamictal and Prozac.  History of excessive cleaning and mood swings.  PCP tried BuSpar that did not work.  Previous Psychotropic Medications: Yes   Substance Abuse History in the last 12 months:  No.  Consequences of Substance Abuse: NA  Past Medical History:  Past Medical History:  Diagnosis Date   Anxiety    Coronavirus infection  02/2020   Depression    Obesity    Partial hearing loss, bilateral    Prediabetes    Vaginal Pap smear, abnormal    Vitamin D deficiency 08/2020    Past Surgical History:  Procedure Laterality Date   WISDOM TOOTH EXTRACTION      Family Psychiatric History: Grandmother had bipolar disorder.  Brother has anxiety and depression  Family History:  Family History  Problem Relation Age of Onset   Anemia Mother    Cancer Maternal Grandmother        ovarian   Diabetes Maternal Grandmother    Diabetes Paternal Grandmother    Diabetes Paternal Grandfather    Heart disease Paternal Grandfather        heart attack    Social History:   Social History   Socioeconomic History   Marital status: Single    Spouse name: Not on file   Number of children: Not on file   Years of education: Not on file   Highest education level: Not on file  Occupational History   Not on file  Tobacco Use   Smoking status: Former   Smokeless tobacco: Never  Scientific laboratory technician Use: Every day  Substance and Sexual Activity   Alcohol use: Yes    Comment: socially   Drug use: No   Sexual activity: Yes    Birth control/protection: Condom, Pill  Other Topics Concern   Not on file  Social History Narrative   Not on file   Social Determinants of Health   Financial Resource Strain: Not on file  Food Insecurity: Not on file  Transportation Needs: Not on file  Physical Activity: Not on file  Stress: Not on file  Social Connections: Not on file    Additional Social History: Patient born and raised until age 75 in [place].  Parents divorced and mother remarried.  Patient has a good relationship with her mother and stepfather.  She is also very close to her biological brother.  Patient married to her husband 4 years ago.  Together they have a 55-year-old son.  Patient's best friend who she considers her sister is also very close to her.  Patient told she stays with her kids from Sunday to Thursday and go  to school from their home.  Allergies:   Allergies  Allergen Reactions   Other     Other reaction(s): Other (See Comments)   Strawberry (Diagnostic)     Metabolic Disorder Labs: Lab Results  Component Value Date   HGBA1C 5.5 08/07/2020   No results found for: "PROLACTIN" No results found for: "CHOL", "TRIG", "HDL", "CHOLHDL", "VLDL", "LDLCALC" Lab Results  Component Value Date   TSH 1.170 08/07/2020    Therapeutic Level Labs: No results found for: "LITHIUM" No results found for: "CBMZ" No results found for: "VALPROATE"  Current Medications: Current Outpatient Medications  Medication Sig Dispense Refill   Acetaminophen-Codeine 300-30 MG tablet      FLUoxetine (PROZAC) 40 MG capsule Take 1 capsule (40 mg total) by mouth daily. 30 capsule 2   lamoTRIgine (LAMICTAL)  25 MG tablet Take 50 mg by mouth daily.     Levonorgestrel-Ethinyl Estradiol (AMETHIA) 0.15-0.03 &0.01 MG tablet Take 1 tablet by mouth daily.     triamcinolone ointment (KENALOG) 0.5 % APPLY TO AFFECTED AREA TWICE A DAY (Patient not taking: Reported on 06/11/2022) 30 g 0   No current facility-administered medications for this visit.    Musculoskeletal: Strength & Muscle Tone: within normal limits Gait & Station: normal Patient leans: N/A  Psychiatric Specialty Exam: Review of Systems  Weight 217 lb (98.4 kg).There is no height or weight on file to calculate BMI.  General Appearance: Casual  Eye Contact:  Good  Speech:  Clear and Coherent  Volume:  Normal  Mood:  Anxious and Dysphoric  Affect:  Congruent  Thought Process:  Goal Directed  Orientation:  Full (Time, Place, and Person)  Thought Content:  Rumination  Suicidal Thoughts:  No  Homicidal Thoughts:  No  Memory:  Immediate;   Good Recent;   Good Remote;   Good  Judgement:  Intact  Insight:  Present  Psychomotor Activity:  Normal  Concentration:  Concentration: Good and Attention Span: Good  Recall:  Good  Fund of Knowledge:Good  Language:  Good  Akathisia:  No  Handed:  Right  AIMS (if indicated):  not done  Assets:  Communication Skills Desire for [place] Talents/Skills Transportation  ADL's:  Intact  Cognition: WNL  Sleep:  Poor   Screenings: PHQ2-9    [place] Office Visit from 05/12/2021 in [place] Office Visit from 03/12/2021 in [place] Office Visit from 08/07/2020 in [place] Office Visit from 11/24/2018 in [place] Office Visit from 07/18/2018 in [place]  PHQ-2 Total Score 0 0 4 0 1  PHQ-9 Total Score 0 -- 23 -- --       Assessment and Plan: Patient is a 32 year old employed, married female with history of depression, anxiety, mood swing and panic attacks.  Discussed psychosocial stressors, current symptoms.  Discussed possibly of underlying mood disorder which patient understand.  The current medicine not working for her.  I recommend to discontinue Lexapro which was started recently and Prozac 40 mg.  Recommend to optimize the Lamictal 100 mg daily, currently she is taking 25 mg 2 times a day.  Recommend to start Wellbutrin XL 150 mg in the morning.  Discussed medication side effects and benefits specially in the beginning may cause headaches, nausea and tremors.  I also recommend to consider restart therapy and patient will consider and if needed she will let us know.  We will follow-up in 3 weeks.  Discuss safety concerns and any time having active suicidal thoughts or homicidal thought that she need to call 911 or go to local emergency room.   Collaboration of Care: Other provider involved in patient's care AEB notes are available in epic to review.  Patient/Guardian was advised Release of Information must be obtained prior to any record release in order to collaborate their care with an outside provider. Patient/Guardian was advised if they have not already  done so to contact the registration department to sign all necessary forms in order for [place] to release information regarding their care.   Consent: Patient/Guardian gives verbal consent for treatment and assignment of benefits for services provided during this visit. Patient/Guardian expressed understanding and agreed to proceed.   Kathlee Nations, MD 3/14/202411:17 AM

## 2022-09-10 ENCOUNTER — Telehealth (HOSPITAL_BASED_OUTPATIENT_CLINIC_OR_DEPARTMENT_OTHER): Payer: 59 | Admitting: Psychiatry

## 2022-09-10 ENCOUNTER — Encounter (HOSPITAL_COMMUNITY): Payer: Self-pay | Admitting: Psychiatry

## 2022-09-10 DIAGNOSIS — F3162 Bipolar disorder, current episode mixed, moderate: Secondary | ICD-10-CM

## 2022-09-10 DIAGNOSIS — F411 Generalized anxiety disorder: Secondary | ICD-10-CM | POA: Diagnosis not present

## 2022-09-10 MED ORDER — BUPROPION HCL ER (XL) 150 MG PO TB24: 150.0000 mg | ORAL_TABLET | ORAL | 0 refills | Status: DC

## 2022-09-10 MED ORDER — LAMOTRIGINE 100 MG PO TABS: 100.0000 mg | ORAL_TABLET | Freq: Every day | ORAL | 0 refills | Status: DC

## 2022-09-10 NOTE — Progress Notes (Signed)
[facility] MD Virtual Progress Note   Patient Location: Home Provider Location: Home Office  I connect with patient by video and verified that I am speaking with correct person by using two identifiers. I discussed the limitations of evaluation and management by telemedicine and the availability of in person appointments. I also discussed with the patient that there may be a patient responsible charge related to this service. The patient expressed understanding and agreed to proceed.  Debbie Robbins [id number] 32 y.o.  09/10/2022 11:02 AM  History of Present Illness:  Patient is evaluated by video session.  She is a 32 year old employed, married female who was referred from primary care for the management of her psychiatric symptoms.  She was struggling with anxiety, depression, irritability, tired with lack of motivation to do things.  She was taking Lamictal and Prozac but did not see it was helping and recently primary care added low-dose Lexapro.  She noticed anger, burst of energy, withdrawn and then socially isolated.  She was reading about bipolar symptoms and felt that symptoms are consistent with underlying mood disorder.  She is also having passive and fleeting suicidal thoughts but no intent.  Patient works at a [employer] and a [place].  Patient was started her on Wellbutrin, increased Lamictal and discontinue Prozac.  She noticed improvement and she is more relaxed and sleeping better.  She cut down caffeine intake and also cut down her soda.  She has not smoke cannabis Gummies since the last visit.  She reported her best friend who she considers her sister also noticed that she is more relaxed,.  She has no rash, itching tremor or shakes or any headaches.  She lost weight since the last visit but also reported having some GI issues which could be the reason for weight loss.  She denies any panic attack.  Past Psychiatric History: No history of suicidal  attempt, inpatient treatment. Struggle with anxiety depression and mood swings since teenager. Saw Priscille Loveless at [facility]. Prescribed Lamictal and Prozac. History of excessive cleaning and mood swings. PCP tried BuSpar and Lexapro that did not work. Prozac discontinued once Lamictal started.   Outpatient Encounter Medications as of 09/10/2022  Medication Sig   Acetaminophen-Codeine 300-30 MG tablet    buPROPion (WELLBUTRIN XL) 150 MG 24 hr tablet Take 1 tablet (150 mg total) by mouth every morning.   busPIRone (BUSPAR) 10 MG tablet Take by mouth. (Patient not taking: Reported on 08/20/2022)   escitalopram (LEXAPRO) 5 MG tablet Take 5 mg by mouth daily. (Patient not taking: Reported on 08/20/2022)   FLUoxetine (PROZAC) 40 MG capsule Take 1 capsule (40 mg total) by mouth daily. (Patient not taking: Reported on 08/20/2022)   lamoTRIgine (LAMICTAL) 100 MG tablet Take 1 tablet (100 mg total) by mouth daily.   Levonorgestrel-Ethinyl Estradiol (AMETHIA) 0.15-0.03 &0.01 MG tablet Take 1 tablet by mouth daily.   triamcinolone ointment (KENALOG) 0.5 % APPLY TO AFFECTED AREA TWICE A DAY (Patient not taking: Reported on 06/11/2022)   No facility-administered encounter medications on file as of 09/10/2022.    No results found for this or any previous visit (from the past 2160 hour(s)).   Psychiatric Specialty Exam: Physical Exam  Review of Systems  Weight 209 lb (94.8 kg).There is no height or weight on file to calculate BMI.  General Appearance: Casual  Eye Contact:  Good  Speech:  Clear and Coherent and Normal Rate  Volume:  Normal  Mood:  Anxious and  Dysphoric  Affect:  Congruent  Thought Process:  Goal Directed  Orientation:  Full (Time, Place, and Person)  Thought Content:  Rumination  Suicidal Thoughts:  No  Homicidal Thoughts:  No  Memory:  Immediate;   Good Recent;   Good Remote;   Good  Judgement:  Good  Insight:  Good  Psychomotor Activity:  Normal  Concentration:   Concentration: Good and Attention Span: Good  Recall:  Good  Fund of Knowledge:  Good  Language:  Good  Akathisia:  No  Handed:  Right  AIMS (if indicated):     Assets:  Communication Skills Desire for Improvement Housing Social Support Talents/Skills Transportation  ADL's:  Intact  Cognition:  WNL  Sleep:  better     Assessment/Plan: GAD (generalized anxiety disorder)  Bipolar 1 disorder, mixed, moderate  Discussed current medication.  She is doing better with adjustment of Lamictal and starting new medication Wellbutrin.  She is no longer taking Prozac and buspirone.  She is also not taking the Lexapro.  She has planned to go back to her previous therapist to help her coping skills.  So far no major issues from the medication.  We will keep the current dose of Lamictal 100 mg daily and Wellbutrin XL 150 mg daily.  We may consider optimizing the dose on her next appointment.  Reemphasis to watch carefully medication side effects especially if she had a rash and she need to stop the medication.  We will follow-up in 6 weeks.   Follow Up Instructions:     I discussed the assessment and treatment plan with the patient. The patient was provided an opportunity to ask questions and all were answered. The patient agreed with the plan and demonstrated an understanding of the instructions.   The patient was advised to call back or seek an in-person evaluation if the symptoms worsen or if the condition fails to improve as anticipated.    Collaboration of Care: Other provider involved in patient's care AEB notes are available in epic to review.  Patient/Guardian was advised Release of Information must be obtained prior to any record release in order to collaborate their care with an outside provider. Patient/Guardian was advised if they have not already done so to contact the registration department to sign all necessary forms in order for [place] to release information regarding their care.    Consent: Patient/Guardian gives verbal consent for treatment and assignment of benefits for services provided during this visit. Patient/Guardian expressed understanding and agreed to proceed.     I provided 30 minutes of non face to face time during this encounter.  Kathlee Nations, MD 09/10/2022

## 2022-10-16 ENCOUNTER — Other Ambulatory Visit (HOSPITAL_COMMUNITY): Payer: Self-pay | Admitting: Psychiatry

## 2022-10-16 DIAGNOSIS — F411 Generalized anxiety disorder: Secondary | ICD-10-CM

## 2022-10-16 DIAGNOSIS — F3162 Bipolar disorder, current episode mixed, moderate: Secondary | ICD-10-CM

## 2022-10-19 ENCOUNTER — Other Ambulatory Visit (HOSPITAL_COMMUNITY): Payer: Self-pay | Admitting: *Deleted

## 2022-10-19 DIAGNOSIS — F411 Generalized anxiety disorder: Secondary | ICD-10-CM

## 2022-10-19 DIAGNOSIS — F3162 Bipolar disorder, current episode mixed, moderate: Secondary | ICD-10-CM

## 2022-10-19 MED ORDER — LAMOTRIGINE 100 MG PO TABS: 100.0000 mg | ORAL_TABLET | Freq: Every day | ORAL | 0 refills | Status: DC

## 2022-10-19 MED ORDER — BUPROPION HCL ER (XL) 150 MG PO TB24
150.0000 mg | ORAL_TABLET | ORAL | 0 refills | Status: DC
Start: 2022-10-19 — End: 2022-10-28

## 2022-10-21 ENCOUNTER — Telehealth (HOSPITAL_COMMUNITY): Payer: 59 | Admitting: Psychiatry

## 2022-10-28 ENCOUNTER — Encounter (HOSPITAL_COMMUNITY): Payer: Self-pay | Admitting: Psychiatry

## 2022-10-28 ENCOUNTER — Telehealth (HOSPITAL_BASED_OUTPATIENT_CLINIC_OR_DEPARTMENT_OTHER): Payer: 59 | Admitting: Psychiatry

## 2022-10-28 VITALS — Wt 205.0 lb

## 2022-10-28 DIAGNOSIS — F411 Generalized anxiety disorder: Secondary | ICD-10-CM

## 2022-10-28 DIAGNOSIS — F3162 Bipolar disorder, current episode mixed, moderate: Secondary | ICD-10-CM | POA: Diagnosis not present

## 2022-10-28 MED ORDER — BUPROPION HCL ER (XL) 150 MG PO TB24
150.0000 mg | ORAL_TABLET | ORAL | 1 refills | Status: DC
Start: 2022-10-28 — End: 2022-12-31

## 2022-10-28 MED ORDER — LAMOTRIGINE 100 MG PO TABS: 100.0000 mg | ORAL_TABLET | Freq: Every day | ORAL | 1 refills | Status: DC

## 2022-10-28 NOTE — Progress Notes (Signed)
[facility] MD Virtual Progress Note   Patient Location: Work Provider Location: Home Office  I connect with patient by video and verified that I am speaking with correct person by using two identifiers. I discussed the limitations of evaluation and management by telemedicine and the availability of in person appointments. I also discussed with the patient that there may be a patient responsible charge related to this service. The patient expressed understanding and agreed to proceed.  Debbie Robbins [id number] 32 y.o.  10/28/2022 8:29 AM  History of Present Illness:  Patient is evaluated by video session.  She is taking Lamictal and Wellbutrin.  She noticed improvement in her mood, irritability, anger and depression.  She is more active and started gardening and regular walking.  She denies any suicidal thoughts or homicidal thoughts.  She admitted some time there are moments when she gets sad and irritable but they are less frequent and less intense.  Patient lives with her 8-year-old and her husband.  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  She admitted not able to contract for previous therapist but hoping to contact her very soon.  She has no rash, tremors, shakes or any EPS.  She occasionally uses cannabis Gummies but that has been cut down significantly.  Her sleep is good.  She feels handling much better her 2-year-old and does not get upset.  She was having a lot of mood swings irritability and getting snappy with the child in the past.  She feels the current medicine regimen is working very well for her.  She lost few pounds since she noticed more active.  She denies any panic attack, crying spells or any feeling of hopelessness or worthlessness.  She feels back to her baseline as more involved in her daily life.  Patient told that she went to a concert 2 days ago and really enjoyed time there.  Past Psychiatric History: No  history of suicidal attempt, inpatient treatment. Struggle with anxiety depression and mood swings since teenager. Saw Palma Holter at [facility]. Prescribed Lamictal and Prozac. History of excessive cleaning and mood swings. PCP tried BuSpar and Lexapro that did not work. Prozac discontinued once Lamictal started.    Outpatient Encounter Medications as of 10/28/2022  Medication Sig   Acetaminophen-Codeine 300-30 MG tablet    buPROPion (WELLBUTRIN XL) 150 MG 24 hr tablet Take 1 tablet (150 mg total) by mouth every morning.   lamoTRIgine (LAMICTAL) 100 MG tablet Take 1 tablet (100 mg total) by mouth daily.   Levonorgestrel-Ethinyl Estradiol (AMETHIA) 0.15-0.03 &0.01 MG tablet Take 1 tablet by mouth daily.   triamcinolone ointment (KENALOG) 0.5 % APPLY TO AFFECTED AREA TWICE A DAY (Patient not taking: Reported on 06/11/2022)   No facility-administered encounter medications on file as of 10/28/2022.    No results found for this or any previous visit (from the past 2160 hour(s)).   Psychiatric Specialty Exam: Physical Exam  Review of Systems  Weight 205 lb (93 kg).Body mass index is 31.63 kg/m.  General Appearance: Casual  Eye Contact:  Good  Speech:  Clear and Coherent and Normal Rate  Volume:  Normal  Mood:  Euthymic  Affect:  Appropriate  Thought Process:  Goal Directed  Orientation:  Full (Time, Place, and Person)  Thought Content:  WDL  Suicidal Thoughts:  No  Homicidal Thoughts:  No  Memory:  Immediate;   Good Recent;   Good Remote;  Good  Judgement:  Good  Insight:  Good  Psychomotor Activity:  Normal  Concentration:  Concentration: Good and Attention Span: Good  Recall:  Good  Fund of Knowledge:  Good  Language:  Good  Akathisia:  No  Handed:  Right  AIMS (if indicated):     Assets:  Communication Skills Desire for Improvement Housing Resilience Social Support Talents/Skills Transportation  ADL's:  Intact  Cognition:  WNL  Sleep:  ok      Assessment/Plan: Bipolar 1 disorder, mixed, moderate (HCC) - Plan: lamoTRIgine (LAMICTAL) 100 MG tablet  GAD (generalized anxiety disorder) - Plan: buPROPion (WELLBUTRIN XL) 150 MG 24 hr tablet, lamoTRIgine (LAMICTAL) 100 MG tablet  Patient doing better on her current medication.  She feels back to her baseline.  Patient has not contact her previous therapist but like to get the appointment soon.  We decided to keep the current medication since it is working.  She has no major concern.  Continue Lamictal 100 mg daily and Wellbutrin XL 150 mg daily.  Recommend to call us back if she has any question or any concern.  Follow-up in 2 months.   Follow Up Instructions:     I discussed the assessment and treatment plan with the patient. The patient was provided an opportunity to ask questions and all were answered. The patient agreed with the plan and demonstrated an understanding of the instructions.   The patient was advised to call back or seek an in-person evaluation if the symptoms worsen or if the condition fails to improve as anticipated.    Collaboration of Care: Other provider involved in patient's care AEB notes are available in epic to review.  Patient/Guardian was advised Release of Information must be obtained prior to any record release in order to collaborate their care with an outside provider. Patient/Guardian was advised if they have not already done so to contact the registration department to sign all necessary forms in order for [place] to release information regarding their care.   Consent: Patient/Guardian gives verbal consent for treatment and assignment of benefits for services provided during this visit. Patient/Guardian expressed understanding and agreed to proceed.     I provided 15 minutes of non face to face time during this encounter.  Note: This document was prepared by [employer] voice dictation technology and any errors that results from this process are unintentional.     Cleotis Nipper, MD 10/28/2022

## 2022-12-11 ENCOUNTER — Ambulatory Visit: Payer: Self-pay | Admitting: Nurse Practitioner

## 2022-12-30 ENCOUNTER — Other Ambulatory Visit (HOSPITAL_COMMUNITY): Payer: Self-pay | Admitting: Psychiatry

## 2022-12-30 DIAGNOSIS — F411 Generalized anxiety disorder: Secondary | ICD-10-CM

## 2022-12-30 DIAGNOSIS — F3162 Bipolar disorder, current episode mixed, moderate: Secondary | ICD-10-CM

## 2022-12-31 ENCOUNTER — Telehealth (HOSPITAL_BASED_OUTPATIENT_CLINIC_OR_DEPARTMENT_OTHER): Payer: 59 | Admitting: Psychiatry

## 2022-12-31 ENCOUNTER — Encounter (HOSPITAL_COMMUNITY): Payer: Self-pay | Admitting: Psychiatry

## 2022-12-31 VITALS — Wt 209.0 lb

## 2022-12-31 DIAGNOSIS — F3162 Bipolar disorder, current episode mixed, moderate: Secondary | ICD-10-CM | POA: Diagnosis not present

## 2022-12-31 DIAGNOSIS — F411 Generalized anxiety disorder: Secondary | ICD-10-CM | POA: Diagnosis not present

## 2022-12-31 MED ORDER — BUPROPION HCL ER (XL) 150 MG PO TB24
150.0000 mg | ORAL_TABLET | ORAL | 1 refills | Status: DC
Start: 2022-12-31 — End: 2023-02-16

## 2022-12-31 MED ORDER — LAMOTRIGINE 150 MG PO TABS: 150.0000 mg | ORAL_TABLET | Freq: Every day | ORAL | 1 refills | Status: DC

## 2022-12-31 NOTE — Progress Notes (Signed)
Health MD Virtual Progress Note   Patient Location: Home Provider Location: Office  I connect with patient by video and verified that I am speaking with correct person by using two identifiers. I discussed the limitations of evaluation and management by telemedicine and the availability of in person appointments. I also discussed with the patient that there may be a patient responsible charge related to this service. The patient expressed understanding and agreed to proceed.  Debbie Robbins [id number] 32 y.o.  12/31/2022 3:04 PM  History of Present Illness:  Patient is evaluated by video session.  She admitted continued to have residual mood lability and sometimes gets snappy.  However denies any mania, psychosis, hallucination.  Patient lives with her 12-year-old son and her husband who just came back from work and now he will stay another 3 weeks and then go back to work.  Patient is tolerating Lamictal and denies any rash, itching.  She sleeps okay.  She does go outside with her 20-year-old son and she feels managing better than before.  Overall she feels her anger irritability is better and feels the medicine helping.  She is currently not seeing any therapist.  She reported due to financial strain like to postpone therapy but will consider in the future.  She denies any suicidal thoughts, feeling of hopelessness or worthlessness.  She denies any drug use.  She excited about upcoming concert that she is going in in [place].  She admitted some time impulsively snacking and gained a few pounds but like to control her weight.  She is taking Wellbutrin and Lamictal in the morning.  Past Psychiatric History: No history of suicidal attempt, inpatient treatment. Struggle with anxiety depression and mood swings since teenager. Saw Palma Holter at [facility]. Prescribed Lamictal and Prozac. History of excessive cleaning and mood swings. PCP tried BuSpar and Lexapro  that did not work. Prozac discontinued once Lamictal started.    Outpatient Encounter Medications as of 12/31/2022  Medication Sig   Acetaminophen-Codeine 300-30 MG tablet    buPROPion (WELLBUTRIN XL) 150 MG 24 hr tablet Take 1 tablet (150 mg total) by mouth every morning.   lamoTRIgine (LAMICTAL) 100 MG tablet Take 1 tablet (100 mg total) by mouth daily.   Levonorgestrel-Ethinyl Estradiol (AMETHIA) 0.15-0.03 &0.01 MG tablet Take 1 tablet by mouth daily.   triamcinolone ointment (KENALOG) 0.5 % APPLY TO AFFECTED AREA TWICE A DAY (Patient not taking: Reported on 06/11/2022)   No facility-administered encounter medications on file as of 12/31/2022.    No results found for this or any previous visit (from the past 2160 hour(s)).   Psychiatric Specialty Exam: Physical Exam  Review of Systems  Weight 209 lb (94.8 kg).There is no height or weight on file to calculate BMI.  General Appearance: Casual  Eye Contact:  Good  Speech:  Clear and Coherent and Normal Rate  Volume:  Normal  Mood:  Euthymic  Affect:  Appropriate  Thought Process:  Goal Directed  Orientation:  Full (Time, Place, and Person)  Thought Content:  Logical  Suicidal Thoughts:  No  Homicidal Thoughts:  No  Memory:  Immediate;   Good Recent;   Good Remote;   Good  Judgement:  Intact  Insight:  Good  Psychomotor Activity:  Normal  Concentration:  Concentration: Good and Attention Span: Good  Recall:  Good  Fund of Knowledge:  Good  Language:  Good  Akathisia:  No  Handed:  Right  AIMS (if indicated):  Assets:  Communication Skills Desire for Improvement Housing Resilience Social Support Talents/Skills Transportation  ADL's:  Intact  Cognition:  WNL  Sleep:  ok     Assessment/Plan: Bipolar 1 disorder, mixed, moderate (HCC) - Plan: lamoTRIgine (LAMICTAL) 150 MG tablet  GAD (generalized anxiety disorder) - Plan: buPROPion (WELLBUTRIN XL) 150 MG 24 hr tablet, lamoTRIgine (LAMICTAL) 150 MG  tablet  Discussed residual mood lability.  Recommend to try Lamictal 150 mg daily.  Patient agreed to give a try.  Overall she noticed improvement with the medication.  Continue Wellbutrin XL 150 mg in the morning and she will try Lamictal 150 mg in the morning.  Recommend to call us back if she has any question or any concern.  Follow-up in 2 months.   Follow Up Instructions:     I discussed the assessment and treatment plan with the patient. The patient was provided an opportunity to ask questions and all were answered. The patient agreed with the plan and demonstrated an understanding of the instructions.   The patient was advised to call back or seek an in-person evaluation if the symptoms worsen or if the condition fails to improve as anticipated.    Collaboration of Care: Other provider involved in patient's care AEB notes are available in epic to review.  Patient/Guardian was advised Release of Information must be obtained prior to any record release in order to collaborate their care with an outside provider. Patient/Guardian was advised if they have not already done so to contact the registration department to sign all necessary forms in order for [place] to release information regarding their care.   Consent: Patient/Guardian gives verbal consent for treatment and assignment of benefits for services provided during this visit. Patient/Guardian expressed understanding and agreed to proceed.     I provided 23 minutes of non face to face time during this encounter.  Note: This document was prepared by [employer] voice dictation technology and any errors that results from this process are unintentional.    Cleotis Nipper, MD 12/31/2022

## 2023-01-01 ENCOUNTER — Encounter: Payer: Self-pay | Admitting: Nurse Practitioner

## 2023-01-01 ENCOUNTER — Ambulatory Visit: Payer: 59 | Admitting: Nurse Practitioner

## 2023-01-01 ENCOUNTER — Other Ambulatory Visit (HOSPITAL_COMMUNITY)
Admission: RE | Admit: 2023-01-01 | Discharge: 2023-01-01 | Disposition: A | Discharge status: Home / Self Care | Payer: 59 | Source: Ambulatory Visit | Attending: Nurse Practitioner | Admitting: Nurse Practitioner

## 2023-01-01 VITALS — BP 125/90 | HR 98 | Temp 97.4°F | Wt 210.0 lb

## 2023-01-01 DIAGNOSIS — L678 Other hair color and hair shaft abnormalities: Secondary | ICD-10-CM

## 2023-01-01 DIAGNOSIS — Z124 Encounter for screening for malignant neoplasm of cervix: Secondary | ICD-10-CM | POA: Insufficient documentation

## 2023-01-01 NOTE — Assessment & Plan Note (Signed)
-   Cytology - PAP([place])  2. Abnormal facial hair  - Estrogens, Total - CBC - Comprehensive metabolic panel - Testosterone  Follow up:  Follow up in 6 months

## 2023-01-01 NOTE — Patient Instructions (Addendum)
1. Cervical cancer screening  - Cytology - PAP([place])  2. Abnormal facial hair  - Estrogens, Total - CBC - Comprehensive metabolic panel - Testosterone  Follow up:  Follow up in 6 months

## 2023-01-01 NOTE — Progress Notes (Signed)
@Patient  ID: Debbie Robbins, female    DOB: 01-11-1991, 32 y.o.   MRN: [id number]  Chief Complaint  Patient presents with   Gynecologic Exam    Referring provider: Ivonne Andrew, NP   HPI  32 year old female with past medical history of Anxiety, Coronavirus infection (02/2020), Depression, Obesity, Partial hearing loss, bilateral, Prediabetes, Vaginal Pap smear, abnormal, and Vitamin D deficiency (08/2020).    Patient presents today for Pap smear.  Overall she has been doing well with no new issues or concerns.  She also wants breast exam today.  Patient states that she has noticed more facial hair recently and would like hormones tested. Denies f/c/s, n/v/d, hemoptysis, PND, leg swelling Denies chest pain or edema     Allergies  Allergen Reactions   Other     Other reaction(s): Other (See Comments)   Strawberry (Diagnostic)     Immunization History  Administered Date(s) Administered   Influenza-Unspecified 04/26/2014   Td 06/08/2005   Tdap 05/25/2019    Past Medical History:  Diagnosis Date   Anxiety    Coronavirus infection 02/2020   Depression    Obesity    Partial hearing loss, bilateral    Prediabetes    Vaginal Pap smear, abnormal    Vitamin D deficiency 08/2020    Tobacco History: Social History   Tobacco Use  Smoking Status Former  Smokeless Tobacco Never   Counseling given: Not Answered   Outpatient Encounter Medications as of 01/01/2023  Medication Sig   buPROPion (WELLBUTRIN XL) 150 MG 24 hr tablet Take 1 tablet (150 mg total) by mouth every morning.   lamoTRIgine (LAMICTAL) 150 MG tablet Take 1 tablet (150 mg total) by mouth daily.   Acetaminophen-Codeine 300-30 MG tablet  (Patient not taking: Reported on 01/01/2023)   Levonorgestrel-Ethinyl Estradiol (AMETHIA) 0.15-0.03 &0.01 MG tablet Take 1 tablet by mouth daily. (Patient not taking: Reported on 01/01/2023)   triamcinolone ointment (KENALOG) 0.5 % APPLY TO AFFECTED AREA TWICE A DAY  (Patient not taking: Reported on 06/11/2022)   No facility-administered encounter medications on file as of 01/01/2023.     Review of Systems  Review of Systems  Constitutional: Negative.   HENT: Negative.    Cardiovascular: Negative.   Gastrointestinal: Negative.   Allergic/Immunologic: Negative.   Neurological: Negative.   Psychiatric/Behavioral: Negative.         Physical Exam  BP (!) 125/90   Pulse 98   Temp (!) 97.4 F (36.3 C)   Wt 210 lb (95.3 kg)   SpO2 98%   BMI 32.41 kg/m   Wt Readings from Last 5 Encounters:  01/01/23 210 lb (95.3 kg)  06/11/22 217 lb (98.4 kg)  09/08/21 226 lb 3.2 oz (102.6 kg)  05/12/21 211 lb 2 oz (95.8 kg)  03/12/21 203 lb (92.1 kg)     Physical Exam Vitals and nursing note reviewed. Exam conducted with a chaperone present.  Constitutional:      General: She is not in acute distress.    Appearance: She is well-developed.  Cardiovascular:     Rate and Rhythm: Normal rate and regular rhythm.  Pulmonary:     Effort: Pulmonary effort is normal.     Breath sounds: Normal breath sounds.  Genitourinary:    General: Normal vulva.     Cervix: Friability present.  Neurological:     Mental Status: She is alert and oriented to person, place, and time.      Lab Results:  CBC  Component Value Date/Time   WBC 7.4 06/11/2022 1630   WBC 10.3 08/10/2019 0609   RBC 4.30 06/11/2022 1630   RBC 3.59 (L) 08/10/2019 0609   HGB 11.6 06/11/2022 1630   HCT 35.5 06/11/2022 1630   PLT 196 06/11/2022 1630   MCV 83 06/11/2022 1630   MCH 27.0 06/11/2022 1630   MCH 24.0 (L) 08/10/2019 0609   MCHC 32.7 06/11/2022 1630   MCHC 30.2 08/10/2019 0609   RDW 13.2 06/11/2022 1630   LYMPHSABS 1.6 08/07/2020 1042   EOSABS 0.1 08/07/2020 1042   BASOSABS 0.0 08/07/2020 1042    BMET    Component Value Date/Time   NA 139 06/11/2022 1630   K 4.1 06/11/2022 1630   CL 104 06/11/2022 1630   CO2 24 06/11/2022 1630   GLUCOSE 94 06/11/2022 1630   BUN  8 06/11/2022 1630   CREATININE 0.77 06/11/2022 1630   CALCIUM 9.0 06/11/2022 1630     Assessment & Plan:   Cervical cancer screening - Cytology - PAP([place])  2. Abnormal facial hair  - Estrogens, Total - CBC - Comprehensive metabolic panel - Testosterone  Follow up:  Follow up in 6 months     Ivonne Andrew, NP 01/01/2023

## 2023-02-01 ENCOUNTER — Other Ambulatory Visit (HOSPITAL_COMMUNITY): Payer: Self-pay | Admitting: Psychiatry

## 2023-02-01 DIAGNOSIS — F411 Generalized anxiety disorder: Secondary | ICD-10-CM

## 2023-02-01 DIAGNOSIS — F3162 Bipolar disorder, current episode mixed, moderate: Secondary | ICD-10-CM

## 2023-02-16 ENCOUNTER — Other Ambulatory Visit (HOSPITAL_COMMUNITY): Payer: Self-pay

## 2023-02-16 DIAGNOSIS — F411 Generalized anxiety disorder: Secondary | ICD-10-CM

## 2023-02-16 DIAGNOSIS — F3162 Bipolar disorder, current episode mixed, moderate: Secondary | ICD-10-CM

## 2023-02-16 MED ORDER — BUPROPION HCL ER (XL) 150 MG PO TB24: 150.0000 mg | ORAL_TABLET | ORAL | 0 refills | Status: DC

## 2023-02-16 MED ORDER — LAMOTRIGINE 150 MG PO TABS
150.0000 mg | ORAL_TABLET | Freq: Every day | ORAL | 0 refills | Status: DC
Start: 2023-02-16 — End: 2023-03-29

## 2023-03-03 ENCOUNTER — Telehealth (HOSPITAL_COMMUNITY): Payer: 59 | Admitting: Psychiatry

## 2023-03-15 ENCOUNTER — Other Ambulatory Visit (HOSPITAL_COMMUNITY): Payer: Self-pay | Admitting: Psychiatry

## 2023-03-15 DIAGNOSIS — F411 Generalized anxiety disorder: Secondary | ICD-10-CM

## 2023-03-15 DIAGNOSIS — F3162 Bipolar disorder, current episode mixed, moderate: Secondary | ICD-10-CM

## 2023-03-29 ENCOUNTER — Encounter (HOSPITAL_COMMUNITY): Payer: Self-pay | Admitting: Psychiatry

## 2023-03-29 ENCOUNTER — Telehealth (HOSPITAL_COMMUNITY): Payer: 59 | Admitting: Psychiatry

## 2023-03-29 VITALS — Wt 210.0 lb

## 2023-03-29 DIAGNOSIS — F3162 Bipolar disorder, current episode mixed, moderate: Secondary | ICD-10-CM

## 2023-03-29 DIAGNOSIS — F411 Generalized anxiety disorder: Secondary | ICD-10-CM | POA: Diagnosis not present

## 2023-03-29 DIAGNOSIS — F338 Other recurrent depressive disorders: Secondary | ICD-10-CM | POA: Diagnosis not present

## 2023-03-29 MED ORDER — LAMOTRIGINE 150 MG PO TABS
150.0000 mg | ORAL_TABLET | Freq: Every day | ORAL | 0 refills | Status: DC
Start: 2023-03-29 — End: 2023-06-24

## 2023-03-29 MED ORDER — BUPROPION HCL ER (XL) 150 MG PO TB24: 150.0000 mg | ORAL_TABLET | ORAL | 0 refills | Status: DC

## 2023-03-29 NOTE — Progress Notes (Signed)
[facility] MD Virtual Progress Note   Patient Location: In Car Provider Location: Home Office  I connect with patient by video and verified that I am speaking with correct person by using two identifiers. I discussed the limitations of evaluation and management by telemedicine and the availability of in person appointments. I also discussed with the patient that there may be a patient responsible charge related to this service. The patient expressed understanding and agreed to proceed.  Debbie Robbins [id number] 32 y.o.  03/29/2023 2:12 PM  History of Present Illness:  Patient is evaluated by video session.  On the last visit we increased the Lamictal and she is taking 150 mg.  She reported mood is much better but concerned about seasonal affective symptoms.  Patient usually had more sadness and depression after the fall.  She is trying to learn how to cope with and started to stay in the sun most of the time.  She is happy as husband will work from home in winter rather than traveling.  She reported her 6-year-old son Debbie Robbins is in the process of getting testing for autism spectrum because some time is proficient in the school is concerned about his behavior and communication.  She is not sure if he will require medication but patient is against about giving medication.  She is more focused on parenting to help those symptoms.  She is sleeping good.  She denies any irritability, anger, mania, psychosis or any hallucination.  She like the Lamictal and Wellbutrin combination.  She sleeps okay.  She excited about upcoming cruise trip to [place] in first week of November.  Her appetite is okay.  Her weight is stable.  She like to continue current medication.  Past Psychiatric History: No history of suicidal attempt, inpatient treatment. Struggle with anxiety depression and mood swings since teenager. Saw Palma Holter at [facility]. Prescribed Lamictal and Prozac.  History of excessive cleaning and mood swings. PCP tried BuSpar and Lexapro that did not work. Prozac discontinued once Lamictal started.    Outpatient Encounter Medications as of 03/29/2023  Medication Sig   Acetaminophen-Codeine 300-30 MG tablet  (Patient not taking: Reported on 01/01/2023)   buPROPion (WELLBUTRIN XL) 150 MG 24 hr tablet Take 1 tablet (150 mg total) by mouth every morning.   lamoTRIgine (LAMICTAL) 150 MG tablet Take 1 tablet (150 mg total) by mouth daily.   Levonorgestrel-Ethinyl Estradiol (AMETHIA) 0.15-0.03 &0.01 MG tablet Take 1 tablet by mouth daily. (Patient not taking: Reported on 01/01/2023)   triamcinolone ointment (KENALOG) 0.5 % APPLY TO AFFECTED AREA TWICE A DAY (Patient not taking: Reported on 06/11/2022)   No facility-administered encounter medications on file as of 03/29/2023.    Recent Results (from the past 2160 hour(s))  Cytology - PAP([facility])     Status: None   Collection Time: 01/01/23  3:41 PM  Result Value Ref Range   Adequacy      Satisfactory for evaluation; transformation zone component PRESENT.   Diagnosis      - Negative for intraepithelial lesion or malignancy (NILM)  Estrogens, Total     Status: None   Collection Time: 01/01/23  3:48 PM  Result Value Ref Range   Estrogen 111 pg/mL    Comment:                          Prepubertal             < 40  Female Cycle:                            1-10 Days         16 - 328                            11-20 Days        34 - 501                            21-30 Days        48 - 350                            Post-Menopausal   40 - 244   CBC     Status: None   Collection Time: 01/01/23  3:48 PM  Result Value Ref Range   WBC 7.4 3.4 - 10.8 x10E3/uL   RBC 4.71 3.77 - 5.28 x10E6/uL   Hemoglobin 12.8 11.1 - 15.9 g/dL   Hematocrit 16.1 09.6 - 46.6 %   MCV 83 79 - 97 fL   MCH 27.2 26.6 - 33.0 pg   MCHC 32.7 31.5 - 35.7 g/dL   RDW 04.5 40.9 - 81.1 %   Platelets 200 150 -  450 x10E3/uL  Comprehensive metabolic panel     Status: None   Collection Time: 01/01/23  3:48 PM  Result Value Ref Range   Glucose 86 70 - 99 mg/dL   BUN 12 6 - 20 mg/dL   Creatinine, Ser 9.14 0.57 - 1.00 mg/dL   eGFR 782 >95 AO/ZHY/8.65   BUN/Creatinine Ratio 16 9 - 23   Sodium 141 134 - 144 mmol/L   Potassium 4.1 3.5 - 5.2 mmol/L   Chloride 105 96 - 106 mmol/L   CO2 23 20 - 29 mmol/L   Calcium 9.5 8.7 - 10.2 mg/dL   Total Protein 7.1 6.0 - 8.5 g/dL   Albumin 4.4 3.9 - 4.9 g/dL   Globulin, Total 2.7 1.5 - 4.5 g/dL   Bilirubin Total <7.8 0.0 - 1.2 mg/dL   Alkaline Phosphatase 109 44 - 121 IU/L   AST 15 0 - 40 IU/L   ALT 15 0 - 32 IU/L  Testosterone     Status: None   Collection Time: 01/01/23  3:48 PM  Result Value Ref Range   Testosterone 25 8 - 60 ng/dL     Psychiatric Specialty Exam: Physical Exam  Review of Systems  Weight 210 lb (95.3 kg).There is no height or weight on file to calculate BMI.  General Appearance: Casual  Eye Contact:  Good  Speech:  Clear and Coherent and Normal Rate  Volume:  Normal  Mood:  Euthymic  Affect:  Congruent  Thought Process:  Goal Directed  Orientation:  Full (Time, Place, and Person)  Thought Content:  Logical  Suicidal Thoughts:  No  Homicidal Thoughts:  No  Memory:  Immediate;   Good Recent;   Good Remote;   Good  Judgement:  Good  Insight:  Good  Psychomotor Activity:  Normal  Concentration:  Concentration: Good and Attention Span: Good  Recall:  Good  Fund of Knowledge:  Good  Language:  Good  Akathisia:  No  Handed:  Right  AIMS (if indicated):     Assets:  Communication Skills Desire for  Improvement Housing Social Support Transportation  ADL's:  Intact  Cognition:  WNL  Sleep:  ok     Assessment/Plan: Bipolar 1 disorder, mixed, moderate (HCC) - Plan: lamoTRIgine (LAMICTAL) 150 MG tablet  GAD (generalized anxiety disorder) - Plan: buPROPion (WELLBUTRIN XL) 150 MG 24 hr tablet, lamoTRIgine (LAMICTAL) 150 MG  tablet  Seasonal affective disorder (HCC) - Plan: buPROPion (WELLBUTRIN XL) 150 MG 24 hr tablet  Patient is stable on current medication.  She really liked increase Lamictal which is helping her mood and irritability.  We discussed increasing light after the dark to help her seasonal affective disorder.  She agreed and she also like to spend more time when the sun is out.  She is hoping husband staying at home in winter will also helpful.  She has no side effects from the medication.  Continue Wellbutrin XL 150 mg in the morning and Lamictal 150 mg daily.  Recommend to call us back if she has any question or any concern.  Follow-up in 3 months.  Follow Up Instructions:     I discussed the assessment and treatment plan with the patient. The patient was provided an opportunity to ask questions and all were answered. The patient agreed with the plan and demonstrated an understanding of the instructions.   The patient was advised to call back or seek an in-person evaluation if the symptoms worsen or if the condition fails to improve as anticipated.    Collaboration of Care: Other provider involved in patient's care AEB notes are available in epic to review  Patient/Guardian was advised Release of Information must be obtained prior to any record release in order to collaborate their care with an outside provider. Patient/Guardian was advised if they have not already done so to contact the registration department to sign all necessary forms in order for [place] to release information regarding their care.   Consent: Patient/Guardian gives verbal consent for treatment and assignment of benefits for services provided during this visit. Patient/Guardian expressed understanding and agreed to proceed.     I provided 22 minutes of non face to face time during this encounter.  Note: This document was prepared by [employer] voice dictation technology and any errors that results from this process are unintentional.     Cleotis Nipper, MD 03/29/2023

## 2023-05-13 ENCOUNTER — Ambulatory Visit: Payer: Self-pay | Admitting: Nurse Practitioner

## 2023-06-24 ENCOUNTER — Encounter (HOSPITAL_COMMUNITY): Payer: Self-pay | Admitting: Psychiatry

## 2023-06-24 ENCOUNTER — Telehealth (HOSPITAL_COMMUNITY): Payer: 59 | Admitting: Psychiatry

## 2023-06-24 VITALS — Wt 210.0 lb

## 2023-06-24 DIAGNOSIS — F338 Other recurrent depressive disorders: Secondary | ICD-10-CM

## 2023-06-24 DIAGNOSIS — F411 Generalized anxiety disorder: Secondary | ICD-10-CM

## 2023-06-24 DIAGNOSIS — F3162 Bipolar disorder, current episode mixed, moderate: Secondary | ICD-10-CM

## 2023-06-24 MED ORDER — LAMOTRIGINE 150 MG PO TABS
150.0000 mg | ORAL_TABLET | Freq: Every day | ORAL | 0 refills | Status: DC
Start: 2023-06-24 — End: 2023-11-29

## 2023-06-24 MED ORDER — BUPROPION HCL ER (XL) 150 MG PO TB24
150.0000 mg | ORAL_TABLET | ORAL | 0 refills | Status: DC
Start: 2023-06-24 — End: 2023-11-29

## 2023-06-24 NOTE — Progress Notes (Signed)
[facility] MD Virtual Progress Note   Patient Location: In Car Provider Location: Office  I connect with patient by video and verified that I am speaking with correct person by using two identifiers. I discussed the limitations of evaluation and management by telemedicine and the availability of in person appointments. I also discussed with the patient that there may be a patient responsible charge related to this service. The patient expressed understanding and agreed to proceed.  Debbie Robbins [id number] 33 y.o.  06/24/2023 12:43 PM  History of Present Illness:  Patient is evaluated by video session.  She reported it was a best winter season as she really enjoyed Christmas and family gathering.  Her husband is at home but going to start traveling in February.  She had extra lights to deal with the seasonal affective disorder but did not have to use it.  She denies any mania, psychosis, hallucination.  She denies any crying spells or any feeling of hopelessness or worthlessness.  Her anxiety is under control.  She does not feel overwhelmed easily.  She sleeps good.  She reported her 73-year-old son finally got the appointment to see a psychologist in May for autism spectrum evaluation.  She has no rash or any itching.  She really feels the Lamictal and Wellbutrin combination is working very well.  She had a good cruise trip for [place] 2 months ago.  Her appetite is okay.  Her weight is stable.  She would like to continue current medication.  Past Psychiatric History: No history of suicidal attempt, inpatient treatment. Struggle with anxiety depression and mood swings since teenager. Saw Palma Holter at [facility]. Prescribed Lamictal and Prozac. History of excessive cleaning and mood swings. PCP tried BuSpar and Lexapro that did not work. Prozac discontinued once Lamictal started.    Outpatient Encounter Medications as of 06/24/2023  Medication Sig    Acetaminophen-Codeine 300-30 MG tablet  (Patient not taking: Reported on 01/01/2023)   buPROPion (WELLBUTRIN XL) 150 MG 24 hr tablet Take 1 tablet (150 mg total) by mouth every morning.   lamoTRIgine (LAMICTAL) 150 MG tablet Take 1 tablet (150 mg total) by mouth daily.   Levonorgestrel-Ethinyl Estradiol (AMETHIA) 0.15-0.03 &0.01 MG tablet Take 1 tablet by mouth daily. (Patient not taking: Reported on 01/01/2023)   triamcinolone ointment (KENALOG) 0.5 % APPLY TO AFFECTED AREA TWICE A DAY (Patient not taking: Reported on 06/11/2022)   No facility-administered encounter medications on file as of 06/24/2023.    No results found for this or any previous visit (from the past 2160 hours).   Psychiatric Specialty Exam: Physical Exam  Review of Systems  Weight 210 lb (95.3 kg).There is no height or weight on file to calculate BMI.  General Appearance: Casual  Eye Contact:  Good  Speech:  Clear and Coherent  Volume:  Normal  Mood:  Euthymic  Affect:  Congruent  Thought Process:  Goal Directed  Orientation:  Full (Time, Place, and Person)  Thought Content:  WDL  Suicidal Thoughts:  No  Homicidal Thoughts:  No  Memory:  Immediate;   Good Recent;   Good Remote;   Good  Judgement:  Good  Insight:  Good  Psychomotor Activity:  Normal  Concentration:  Concentration: Good and Attention Span: Good  Recall:  Good  Fund of Knowledge:  Good  Language:  Good  Akathisia:  No  Handed:  Right  AIMS (if indicated):     Assets:  Communication Skills Desire for Improvement Housing  Social Support Transportation  ADL's:  Intact  Cognition:  WNL  Sleep:  better     Assessment/Plan: Bipolar 1 disorder, mixed, moderate (HCC) - Plan: lamoTRIgine (LAMICTAL) 150 MG tablet  GAD (generalized anxiety disorder) - Plan: buPROPion (WELLBUTRIN XL) 150 MG 24 hr tablet, lamoTRIgine (LAMICTAL) 150 MG tablet  Seasonal affective disorder (HCC) - Plan: buPROPion (WELLBUTRIN XL) 150 MG 24 hr tablet  Patient is  stable on current medication.  She has no rash or any itching.  She had a good holidays and winter months.  She does not have to use extra light even though she bought.  She has no concern from the medication.  Continue Wellbutrin XL 150 mg in the morning and Lamictal 150 mg daily.  Recommended to call us back if she has any question or any concern.  Follow-up in 3 months.  Her 62-year-old son Debbie Robbins has appointment to see a psychologist in May for autism spectrum evaluation.   Follow Up Instructions:     I discussed the assessment and treatment plan with the patient. The patient was provided an opportunity to ask questions and all were answered. The patient agreed with the plan and demonstrated an understanding of the instructions.   The patient was advised to call back or seek an in-person evaluation if the symptoms worsen or if the condition fails to improve as anticipated.    Collaboration of Care: Other provider involved in patient's care AEB notes are available in epic to review  Patient/Guardian was advised Release of Information must be obtained prior to any record release in order to collaborate their care with an outside provider. Patient/Guardian was advised if they have not already done so to contact the registration department to sign all necessary forms in order for [place] to release information regarding their care.   Consent: Patient/Guardian gives verbal consent for treatment and assignment of benefits for services provided during this visit. Patient/Guardian expressed understanding and agreed to proceed.     I provided 15 minutes of non face to face time during this encounter.  Note: This document was prepared by [employer] voice dictation technology and any errors that results from this process are unintentional.    Cleotis Nipper, MD 06/24/2023

## 2023-06-29 ENCOUNTER — Telehealth (HOSPITAL_COMMUNITY): Payer: 59 | Admitting: Psychiatry

## 2023-07-05 ENCOUNTER — Ambulatory Visit: Payer: Self-pay | Admitting: Nurse Practitioner

## 2023-07-06 ENCOUNTER — Ambulatory Visit: Payer: Self-pay | Admitting: Nurse Practitioner

## 2023-10-31 ENCOUNTER — Other Ambulatory Visit (HOSPITAL_COMMUNITY): Payer: Self-pay | Admitting: Psychiatry

## 2023-10-31 DIAGNOSIS — F3162 Bipolar disorder, current episode mixed, moderate: Secondary | ICD-10-CM

## 2023-10-31 DIAGNOSIS — F411 Generalized anxiety disorder: Secondary | ICD-10-CM

## 2023-11-05 ENCOUNTER — Other Ambulatory Visit (HOSPITAL_COMMUNITY): Payer: Self-pay | Admitting: Psychiatry

## 2023-11-05 DIAGNOSIS — F411 Generalized anxiety disorder: Secondary | ICD-10-CM

## 2023-11-05 DIAGNOSIS — F338 Other recurrent depressive disorders: Secondary | ICD-10-CM

## 2023-11-19 ENCOUNTER — Other Ambulatory Visit (HOSPITAL_COMMUNITY): Payer: Self-pay | Admitting: Psychiatry

## 2023-11-19 DIAGNOSIS — F3162 Bipolar disorder, current episode mixed, moderate: Secondary | ICD-10-CM

## 2023-11-19 DIAGNOSIS — F411 Generalized anxiety disorder: Secondary | ICD-10-CM

## 2023-11-29 ENCOUNTER — Telehealth (HOSPITAL_COMMUNITY): Payer: Self-pay | Admitting: *Deleted

## 2023-11-29 ENCOUNTER — Other Ambulatory Visit (HOSPITAL_COMMUNITY): Payer: Self-pay | Admitting: Psychiatry

## 2023-11-29 ENCOUNTER — Other Ambulatory Visit (HOSPITAL_COMMUNITY): Payer: Self-pay | Admitting: *Deleted

## 2023-11-29 DIAGNOSIS — F338 Other recurrent depressive disorders: Secondary | ICD-10-CM

## 2023-11-29 DIAGNOSIS — F411 Generalized anxiety disorder: Secondary | ICD-10-CM

## 2023-11-29 DIAGNOSIS — F3162 Bipolar disorder, current episode mixed, moderate: Secondary | ICD-10-CM

## 2023-11-29 MED ORDER — LAMOTRIGINE 150 MG PO TABS
150.0000 mg | ORAL_TABLET | Freq: Every day | ORAL | 1 refills | Status: DC
Start: 2023-11-29 — End: 2024-01-04

## 2023-11-29 MED ORDER — BUPROPION HCL ER (XL) 150 MG PO TB24: 150.0000 mg | ORAL_TABLET | ORAL | 1 refills | Status: DC

## 2023-11-29 NOTE — Telephone Encounter (Signed)
 Pt called requesting refills of the Lamictal  and Wellbutrin . Pt advised that she has not been seen since 06/24/23 and has no f/u scheduled. Pt advised a bridge may be sent to pharmacy once there is an appointment scheduled. Pt is ow scheduled for f/u 01/04/24. Ok to send bridge?

## 2023-12-06 NOTE — Telephone Encounter (Signed)
 Send the bridge medication until her next appointment.

## 2023-12-06 NOTE — Telephone Encounter (Signed)
 Was done

## 2024-01-04 ENCOUNTER — Encounter (HOSPITAL_COMMUNITY): Payer: Self-pay | Admitting: Psychiatry

## 2024-01-04 ENCOUNTER — Telehealth (HOSPITAL_COMMUNITY): Admitting: Psychiatry

## 2024-01-04 DIAGNOSIS — F338 Other recurrent depressive disorders: Secondary | ICD-10-CM

## 2024-01-04 DIAGNOSIS — F3162 Bipolar disorder, current episode mixed, moderate: Secondary | ICD-10-CM | POA: Diagnosis not present

## 2024-01-04 DIAGNOSIS — F411 Generalized anxiety disorder: Secondary | ICD-10-CM | POA: Diagnosis not present

## 2024-01-04 MED ORDER — BUPROPION HCL ER (XL) 150 MG PO TB24: 150.0000 mg | ORAL_TABLET | ORAL | 0 refills | Status: DC

## 2024-01-04 MED ORDER — LAMOTRIGINE 150 MG PO TABS: 150.0000 mg | ORAL_TABLET | Freq: Every day | ORAL | 0 refills | Status: DC

## 2024-01-04 NOTE — Progress Notes (Signed)
 [facility] MD Virtual Progress Note   Patient Location: In Car Provider Location: Home Office  I connect with patient by video and verified that I am speaking with correct person by using two identifiers. I discussed the limitations of evaluation and management by telemedicine and the availability of in person appointments. I also discussed with the patient that there may be a patient responsible charge related to this service. The patient expressed understanding and agreed to proceed.  Debbie Robbins [id number] 33 y.o.  01/04/2024 11:34 AM  History of Present Illness:  Patient is evaluated by video session.  She reported some stress lately as 89-year-old son finally given the diagnosis of autism spectrum disorder and since he is so young they have to wait for ADHD diagnosis.  Patient told son's handful at some time patient get irritated and wake up in a bad mood.  She also reported some stress coming from the work as company where she is working as brought by [employer] and a lot of changes at work.  Patient work in person.  She has good support from family including sister-in-law but recently sister-in-law had a newborn baby.  She is taking the Lamictal  and Wellbutrin .  She reported otherwise medicine working and helping.  Some mild she is struggled with sleep and does not feel that sleep is restful.  She has no tremors, shakes or rash.  Her appetite is okay.  Her weight is fine.  She denies any impulsive behavior, agitation, paranoia, hallucination or any suicidal thoughts.  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  Patient denies panic attack but admitted sometimes feeling overwhelmed which could be situational.  Past Psychiatric History: No history of suicidal attempt, inpatient treatment. Struggle with anxiety depression and mood swings since teenager. Saw Izetta Senters at [facility]. Prescribed Lamictal  and Prozac . History of excessive  cleaning and mood swings. PCP tried BuSpar  and Lexapro that did not work. Prozac  discontinued once Lamictal  started.   Past Medical History:  Diagnosis Date   Anxiety    Coronavirus infection 02/2020   Depression    Obesity    Partial hearing loss, bilateral    Prediabetes    Vaginal Pap smear, abnormal    Vitamin D deficiency 08/2020    Outpatient Encounter Medications as of 01/04/2024  Medication Sig   Acetaminophen -Codeine 300-30 MG tablet  (Patient not taking: Reported on 01/01/2023)   buPROPion  (WELLBUTRIN  XL) 150 MG 24 hr tablet Take 1 tablet (150 mg total) by mouth every morning.   lamoTRIgine  (LAMICTAL ) 150 MG tablet Take 1 tablet (150 mg total) by mouth daily.   Levonorgestrel-Ethinyl Estradiol (AMETHIA) 0.15-0.03 &0.01 MG tablet Take 1 tablet by mouth daily. (Patient not taking: Reported on 01/01/2023)   triamcinolone  ointment (KENALOG ) 0.5 % APPLY TO AFFECTED AREA TWICE A DAY (Patient not taking: Reported on 06/11/2022)   No facility-administered encounter medications on file as of 01/04/2024.    No results found for this or any previous visit (from the past 2160 hours).   Psychiatric Specialty Exam: Physical Exam  Review of Systems  Weight 215 lb (97.5 kg).There is no height or weight on file to calculate BMI.  General Appearance: Casual  Eye Contact:  Good  Speech:  Clear and Coherent  Volume:  Normal  Mood:  Anxious  Affect:  Congruent  Thought Process:  Goal Directed  Orientation:  Full (Time, Place, and Person)  Thought Content:  WDL  Suicidal Thoughts:  No  Homicidal  Thoughts:  No  Memory:  Immediate;   Good Recent;   Good Remote;   Good  Judgement:  Good  Insight:  Good  Psychomotor Activity:  Normal  Concentration:  Concentration: Good and Attention Span: Good  Recall:  Good  Fund of Knowledge:  Good  Language:  Good  Akathisia:  No  Handed:  Right  AIMS (if indicated):     Assets:  Communication Skills Desire for Improvement Housing Social  Support Talents/Skills Transportation  ADL's:  Intact  Cognition:  WNL  Sleep:  fair       08/20/2022   11:50 AM 05/12/2021    2:59 PM 03/12/2021    3:30 PM 08/07/2020    9:48 AM 11/24/2018    2:03 PM  Depression screen PHQ 2/9  Decreased Interest 3 0 0 2 0  Down, Depressed, Hopeless 2 0 0 2 0  PHQ - 2 Score 5 0 0 4 0  Altered sleeping 2 0  3   Tired, decreased energy 3 0  3   Change in appetite 2 0  3   Feeling bad or failure about yourself  2 0  2   Trouble concentrating 3 0  3   Moving slowly or fidgety/restless 1 0  3   Suicidal thoughts 1 0  2   PHQ-9 Score 19 0  23   Difficult doing work/chores Somewhat difficult        Assessment/Plan: GAD (generalized anxiety disorder) - Plan: lamoTRIgine  (LAMICTAL ) 150 MG tablet, buPROPion  (WELLBUTRIN  XL) 150 MG 24 hr tablet  Bipolar 1 disorder, mixed, moderate (HCC) - Plan: lamoTRIgine  (LAMICTAL ) 150 MG tablet  Seasonal affective disorder (HCC) - Plan: buPROPion  (WELLBUTRIN  XL) 150 MG 24 hr tablet  Discussed stress as recently 15-year-old diagnosed with autism spectrum disorder.  Patient also concerned about job stress but overall she feels the medicine working.  I encourage consider over-the-counter melatonin if sleep not better.  I also encouraged to consider therapy as patient did in the past but did not felt it helps.  She was seeing a therapist at [facility].  Will refer her to other places.  I discussed may consider optimizing the Lamictal  dose if needed in the future and patient agreed that for now keep the same dose and if needed we can increase the medication dosage next time.  She agreed to consider therapy.  Will continue Lamictal  150 mg daily and Wellbutrin  150 mg daily.  Recommend to call back if she has any question or concern.  Follow-up in 3 months.   Follow Up Instructions:     I discussed the assessment and treatment plan with the patient. The patient was provided an opportunity to ask questions and all were answered.  The patient agreed with the plan and demonstrated an understanding of the instructions.   The patient was advised to call back or seek an in-person evaluation if the symptoms worsen or if the condition fails to improve as anticipated.    Collaboration of Care: Other provider involved in patient's care AEB notes are available in epic to review  Patient/Guardian was advised Release of Information must be obtained prior to any record release in order to collaborate their care with an outside provider. Patient/Guardian was advised if they have not already done so to contact the registration department to sign all necessary forms in order for us  to release information regarding their care.   Consent: Patient/Guardian gives verbal consent for treatment and assignment of benefits for  services provided during this visit. Patient/Guardian expressed understanding and agreed to proceed.     Total encounter time 17 minutes which includes face-to-face time, chart reviewed, care coordination, order entry and documentation during this encounter.   Note: This document was prepared by [employer] voice dictation technology and any errors that results from this process are unintentional.    Leni ONEIDA Client, MD 01/04/2024

## 2024-04-04 ENCOUNTER — Telehealth (HOSPITAL_COMMUNITY): Admitting: Psychiatry

## 2024-04-04 ENCOUNTER — Encounter (HOSPITAL_COMMUNITY): Payer: Self-pay | Admitting: Psychiatry

## 2024-04-04 VITALS — Wt 215.0 lb

## 2024-04-04 DIAGNOSIS — F3162 Bipolar disorder, current episode mixed, moderate: Secondary | ICD-10-CM

## 2024-04-04 DIAGNOSIS — F411 Generalized anxiety disorder: Secondary | ICD-10-CM

## 2024-04-04 DIAGNOSIS — F338 Other recurrent depressive disorders: Secondary | ICD-10-CM

## 2024-04-04 MED ORDER — LAMOTRIGINE 150 MG PO TABS: 150.0000 mg | ORAL_TABLET | Freq: Every day | ORAL | 0 refills | Status: DC

## 2024-04-04 MED ORDER — BUPROPION HCL ER (XL) 150 MG PO TB24: 150.0000 mg | ORAL_TABLET | ORAL | 0 refills | Status: DC

## 2024-04-04 NOTE — Progress Notes (Signed)
 [facility] MD Virtual Progress Note   Patient Location: In Car Provider Location: Home Office  I connect with patient by video and verified that I am speaking with correct person by using two identifiers. I discussed the limitations of evaluation and management by telemedicine and the availability of in person appointments. I also discussed with the patient that there may be a patient responsible charge related to this service. The patient expressed understanding and agreed to proceed.  Debbie Robbins [id number] 33 y.o.  04/04/2024 10:40 AM  History of Present Illness:  Patient is evaluated by video session.  She started taking melatonin 3 mg at bedtime which is helping her sleep.  She tried higher dose but it was making her very groggy and sleepy next day.  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  She started looking at other places.  She reported her 67-year-old son started play therapy and that is helpful.  Patient told things are manageable and denies any mania, psychosis, crying spells or any feeling of hopelessness or worthlessness.  We talk about seasonal affective disorder today, she started taking vitamin D and also going to have extra lites due to dark outside.  She is compliant with Lamictal  and Wellbutrin .  Her appetite is okay and weight is stable.  She denies any impulsive behavior, agitation or irritability.  She is pleased that has been during his last round of travel and started first week of November he will stay home for a few months.  Patient told it will be very helpful to take care of 18-1/2-year-old son.  Patient denies any panic attack, crying spells.  She has no rash or any itching from the Lamictal .  She admitted not had a physical and blood work in a while and going to contact PCP to get physical and labs.  Past Psychiatric History: No history of suicidal  attempt, inpatient treatment. Struggle with anxiety depression and mood swings since teenager. Saw Izetta Senters at [facility]. Prescribed Lamictal  and Prozac . History of excessive cleaning and mood swings. PCP tried BuSpar  and Lexapro that did not work. Prozac  discontinued once Lamictal  started.   Past Medical History:  Diagnosis Date   Anxiety    Coronavirus infection 02/2020   Depression    Obesity    Partial hearing loss, bilateral    Prediabetes    Vaginal Pap smear, abnormal    Vitamin D deficiency 08/2020    Outpatient Encounter Medications as of 04/04/2024  Medication Sig   melatonin 3 MG TABS tablet Take 3 mg by mouth at bedtime.   buPROPion  (WELLBUTRIN  XL) 150 MG 24 hr tablet Take 1 tablet (150 mg total) by mouth every morning.   lamoTRIgine  (LAMICTAL ) 150 MG tablet Take 1 tablet (150 mg total) by mouth daily.   Levonorgestrel-Ethinyl Estradiol (AMETHIA) 0.15-0.03 &0.01 MG tablet Take 1 tablet by mouth daily. (Patient not taking: Reported on 01/01/2023)   [DISCONTINUED] Acetaminophen -Codeine 300-30 MG tablet  (Patient not taking: Reported on 01/01/2023)   [DISCONTINUED] buPROPion  (WELLBUTRIN  XL) 150 MG 24 hr tablet Take 1 tablet (150 mg total) by mouth every morning.   [DISCONTINUED] lamoTRIgine  (LAMICTAL ) 150 MG tablet Take 1 tablet (150 mg total) by mouth daily.   [DISCONTINUED] triamcinolone  ointment (KENALOG ) 0.5 % APPLY TO AFFECTED AREA TWICE A DAY (Patient not taking: Reported on 06/11/2022)   No facility-administered encounter medications on  file as of 04/04/2024.    No results found for this or any previous visit (from the past 2160 hours).   Psychiatric Specialty Exam: Physical Exam  Review of Systems  Weight 215 lb (97.5 kg).There is no height or weight on file to calculate BMI.  General Appearance: Casual  Eye Contact:  Good  Speech:  Clear and Coherent and Normal Rate  Volume:  Normal  Mood:  Euthymic  Affect:  Appropriate  Thought Process:   Goal Directed  Orientation:  Full (Time, Place, and Person)  Thought Content:  Logical  Suicidal Thoughts:  No  Homicidal Thoughts:  No  Memory:  Immediate;   Good Recent;   Good Remote;   Good  Judgement:  Good  Insight:  Good  Psychomotor Activity:  Normal  Concentration:  Concentration: Good and Attention Span: Good  Recall:  Good  Fund of Knowledge:  Good  Language:  Good  Akathisia:  No  Handed:  Right  AIMS (if indicated):     Assets:  Communication Skills Desire for Improvement Housing Resilience Social Support Talents/Skills Transportation  ADL's:  Intact  Cognition:  WNL  Sleep:  ok with Melatonin        08/20/2022   11:50 AM 05/12/2021    2:59 PM 03/12/2021    3:30 PM 08/07/2020    9:48 AM 11/24/2018    2:03 PM  Depression screen PHQ 2/9  Decreased Interest 3 0 0 2 0  Down, Depressed, Hopeless 2 0 0 2 0  PHQ - 2 Score 5 0 0 4 0  Altered sleeping 2 0  3   Tired, decreased energy 3 0  3   Change in appetite 2 0  3   Feeling bad or failure about yourself  2 0  2   Trouble concentrating 3 0  3   Moving slowly or fidgety/restless 1 0  3   Suicidal thoughts 1 0  2   PHQ-9 Score 19 0  23   Difficult doing work/chores Somewhat difficult        Assessment/Plan: Bipolar 1 disorder, mixed, moderate (HCC) - Plan: lamoTRIgine  (LAMICTAL ) 150 MG tablet  GAD (generalized anxiety disorder) - Plan: buPROPion  (WELLBUTRIN  XL) 150 MG 24 hr tablet, lamoTRIgine  (LAMICTAL ) 150 MG tablet  Seasonal affective disorder - Plan: buPROPion  (WELLBUTRIN  XL) 150 MG 24 hr tablet  Patient is a 33 year old married, employed female with history of generalized anxiety disorder, bipolar disorder and seasonal affective disorder.  Reported chronic anxiety but stable.  Taking melatonin 3 mg helping her sleep.  She is taking vitamin D and going to have extra lites at home to help seasonal affective disorder.  She is not interested in therapy as medicine seems to be working well.  Continue Lamictal   150 mg daily, Wellbutrin  XL 150 mg daily and melatonin 3 mg as needed for insomnia.  Recommend to call back if she is any question or any concern.  Follow-up in 3 months.   Follow Up Instructions:     I discussed the assessment and treatment plan with the patient. The patient was provided an opportunity to ask questions and all were answered. The patient agreed with the plan and demonstrated an understanding of the instructions.   The patient was advised to call back or seek an in-person evaluation if the symptoms worsen or if the condition fails to improve as anticipated.    Collaboration of Care: Other provider involved in patient's care AEB notes are available in epic to  review  Patient/Guardian was advised Release of Information must be obtained prior to any record release in order to collaborate their care with an outside provider. Patient/Guardian was advised if they have not already done so to contact the registration department to sign all necessary forms in order for us  to release information regarding their care.   Consent: Patient/Guardian gives verbal consent for treatment and assignment of benefits for services provided during this visit. Patient/Guardian expressed understanding and agreed to proceed.     Total encounter time 19 minutes which includes face-to-face time, chart reviewed, care coordination, order entry and documentation during this encounter.   Note: This document was prepared by [employer] voice dictation technology and any errors that results from this process are unintentional.    Leni ONEIDA Client, MD 04/04/2024

## 2024-06-19 ENCOUNTER — Telehealth (HOSPITAL_COMMUNITY): Admitting: Psychiatry

## 2024-06-19 ENCOUNTER — Encounter (HOSPITAL_COMMUNITY): Payer: Self-pay | Admitting: Psychiatry

## 2024-06-19 VITALS — Wt 220.0 lb

## 2024-06-19 DIAGNOSIS — F3162 Bipolar disorder, current episode mixed, moderate: Secondary | ICD-10-CM

## 2024-06-19 DIAGNOSIS — F39 Unspecified mood [affective] disorder: Secondary | ICD-10-CM

## 2024-06-19 DIAGNOSIS — F338 Other recurrent depressive disorders: Secondary | ICD-10-CM

## 2024-06-19 DIAGNOSIS — F411 Generalized anxiety disorder: Secondary | ICD-10-CM | POA: Diagnosis not present

## 2024-06-19 MED ORDER — LAMOTRIGINE 150 MG PO TABS: 150.0000 mg | ORAL_TABLET | Freq: Every day | ORAL | 1 refills | Status: AC

## 2024-06-19 MED ORDER — BUPROPION HCL ER (XL) 150 MG PO TB24: 150.0000 mg | ORAL_TABLET | ORAL | 1 refills | Status: AC

## 2024-06-19 NOTE — Progress Notes (Signed)
 " [facility] MD Virtual Progress Note   Patient Location: In Car Provider Location: Home Office  I connect with patient by video and verified that I am speaking with correct person by using two identifiers. I discussed the limitations of evaluation and management by telemedicine and the availability of in person appointments. I also discussed with the patient that there may be a patient responsible charge related to this service. The patient expressed understanding and agreed to proceed.  Debbie Robbins [id number] 34 y.o.  06/19/2024 1:41 PM  History of Present Illness:  Patient is evaluated by video session.  She reported things are going very well as husband staying home but he will go back to work in March.  She reported extra lites helps to deal with seasonal affective disorder.  She is sleeping better since husband at home.  She denies any crying spells, irritability, mania, psychosis or any hallucination.  So far her job is not as bad and manageable but she is still looking for a better job.  She try to go outside when weather is good and sunshine.  She admitted maybe few pounds weight gain as husband is a financial risk analyst and lately he is cooking.  Patient is compliant with Lamictal  and Wellbutrin .  She has no rash itching tremor shakes.  She denies any hallucination, paranoia, suicidal thoughts.  Her impulsive behavior is under control.  She denies drinking or using any illegal substances.  Her son is going to be 8 years old in March.  Patient told that she now have to look for elementary school very soon.  Patient like to keep the current medication since it is working well.    Past Psychiatric History: No history of suicidal attempt, inpatient treatment. Struggle with anxiety depression and mood swings since teenager. Saw Izetta Senters at [facility]. Prescribed Lamictal  and Prozac . History of excessive cleaning and mood swings. PCP tried BuSpar  and Lexapro that did not  work. Prozac  discontinued once Lamictal  started.   Past Medical History:  Diagnosis Date   Anxiety    Coronavirus infection 02/2020   Depression    Obesity    Partial hearing loss, bilateral    Prediabetes    Vaginal Pap smear, abnormal    Vitamin D  deficiency 08/2020    Outpatient Encounter Medications as of 06/19/2024  Medication Sig   buPROPion  (WELLBUTRIN  XL) 150 MG 24 hr tablet Take 1 tablet (150 mg total) by mouth every morning.   lamoTRIgine  (LAMICTAL ) 150 MG tablet Take 1 tablet (150 mg total) by mouth daily.   Levonorgestrel-Ethinyl Estradiol (AMETHIA) 0.15-0.03 &0.01 MG tablet Take 1 tablet by mouth daily. (Patient not taking: Reported on 01/01/2023)   melatonin 3 MG TABS tablet Take 3 mg by mouth at bedtime.   No facility-administered encounter medications on file as of 06/19/2024.    No results found for this or any previous visit (from the past 2160 hours).   Psychiatric Specialty Exam: Physical Exam  Review of Systems  Weight 220 lb (99.8 kg).There is no height or weight on file to calculate BMI.  General Appearance: Casual  Eye Contact:  Good  Speech:  Clear and Coherent and Normal Rate  Volume:  Normal  Mood:  Euthymic  Affect:  Appropriate  Thought Process:  Goal Directed  Orientation:  Full (Time, Place, and Person)  Thought Content:  Logical  Suicidal Thoughts:  No  Homicidal Thoughts:  No  Memory:  Immediate;   Good Recent;   Good Remote;  Good  Judgement:  Good  Insight:  Good  Psychomotor Activity:  Normal  Concentration:  Concentration: Good and Attention Span: Good  Recall:  Good  Fund of Knowledge:  Good  Language:  Good  Akathisia:  No  Handed:  Right  AIMS (if indicated):     Assets:  Communication Skills Desire for Improvement Housing Resilience Social Support Talents/Skills Transportation  ADL's:  Intact  Cognition:  WNL  Sleep:  ok        08/20/2022   11:50 AM 05/12/2021    2:59 PM 03/12/2021    3:30 PM 08/07/2020    9:48  AM 11/24/2018    2:03 PM  Depression screen PHQ 2/9  Decreased Interest 3 0 0 2 0  Down, Depressed, Hopeless 2 0 0 2 0  PHQ - 2 Score 5 0 0 4 0  Altered sleeping 2 0  3   Tired, decreased energy 3 0  3   Change in appetite 2 0  3   Feeling bad or failure about yourself  2 0  2   Trouble concentrating 3 0  3   Moving slowly or fidgety/restless 1 0  3   Suicidal thoughts 1 0  2   PHQ-9 Score 19  0   23    Difficult doing work/chores Somewhat difficult         Data saved with a previous flowsheet row definition    Assessment/Plan: Bipolar 1 disorder, mixed, moderate (HCC) - Plan: lamoTRIgine  (LAMICTAL ) 150 MG tablet  GAD (generalized anxiety disorder) - Plan: buPROPion  (WELLBUTRIN  XL) 150 MG 24 hr tablet, lamoTRIgine  (LAMICTAL ) 150 MG tablet  Seasonal affective disorder - Plan: buPROPion  (WELLBUTRIN  XL) 150 MG 24 hr tablet  Patient is a 34 year old married, employed female with history of generalized anxiety disorder, bipolar disorder and seasonal affective disorder.  Patient reported things are stable.  Since her husband is staying home and will go back to work in March she is much relief and sleeping better.  She is using extra lites when outside is dark and cold.  She tried to go outside when weather is better and sunshine.  She does not want to change the medication.  Will keep the Lamictal  150 mg daily and Wellbutrin  XL 150 mg daily.  Recommend to call back if she has any question or any concern.  Occasionally she takes melatonin but lately has not required.  Will try to follow-up in 6 months but patient can call us  for an earlier appointment if needed.   Follow Up Instructions:     I discussed the assessment and treatment plan with the patient. The patient was provided an opportunity to ask questions and all were answered. The patient agreed with the plan and demonstrated an understanding of the instructions.   The patient was advised to call back or seek an in-person evaluation  if the symptoms worsen or if the condition fails to improve as anticipated.    Collaboration of Care: Other provider involved in patient's care AEB notes are available in epic to review  Patient/Guardian was advised Release of Information must be obtained prior to any record release in order to collaborate their care with an outside provider. Patient/Guardian was advised if they have not already done so to contact the registration department to sign all necessary forms in order for us  to release information regarding their care.   Consent: Patient/Guardian gives verbal consent for treatment and assignment of benefits for services provided during this visit.  Patient/Guardian expressed understanding and agreed to proceed.     Total encounter time 19 minutes which includes face-to-face time, chart reviewed, care coordination, order entry and documentation during this encounter.   Note: This document was prepared by [employer] voice dictation technology and any errors that results from this process are unintentional.    Leni ONEIDA Client, MD 06/19/2024   "

## 2024-12-18 ENCOUNTER — Telehealth (HOSPITAL_COMMUNITY): Admitting: Psychiatry
